# Patient Record
Sex: Female | Born: 1935 | Race: White | Hispanic: No | Smoking: Never smoker
Health system: Southern US, Community
[De-identification: ages and names within clinical notes are randomized; demographics above are authoritative.]

## PROBLEM LIST (undated history)

## (undated) DIAGNOSIS — N200 Calculus of kidney: Secondary | ICD-10-CM

## (undated) DIAGNOSIS — I639 Cerebral infarction, unspecified: Secondary | ICD-10-CM

## (undated) DIAGNOSIS — I10 Essential (primary) hypertension: Secondary | ICD-10-CM

## (undated) HISTORY — PX: OTHER SURGICAL HISTORY: SHX169

---

## 2010-12-23 HISTORY — PX: APPENDECTOMY: SHX54

## 2018-06-29 ENCOUNTER — Inpatient Hospital Stay (HOSPITAL_COMMUNITY): Payer: Medicare Other

## 2018-06-29 ENCOUNTER — Inpatient Hospital Stay (HOSPITAL_COMMUNITY)
Admission: EM | Admit: 2018-06-29 | Discharge: 2018-07-23 | DRG: 064 | Disposition: E | Payer: Medicare Other | Attending: Internal Medicine | Admitting: Internal Medicine

## 2018-06-29 ENCOUNTER — Encounter (HOSPITAL_COMMUNITY): Payer: Self-pay | Admitting: Emergency Medicine

## 2018-06-29 ENCOUNTER — Emergency Department (HOSPITAL_COMMUNITY): Payer: Medicare Other

## 2018-06-29 ENCOUNTER — Other Ambulatory Visit: Payer: Self-pay

## 2018-06-29 DIAGNOSIS — I63349 Cerebral infarction due to thrombosis of unspecified cerebellar artery: Secondary | ICD-10-CM | POA: Diagnosis not present

## 2018-06-29 DIAGNOSIS — Z79899 Other long term (current) drug therapy: Secondary | ICD-10-CM

## 2018-06-29 DIAGNOSIS — J690 Pneumonitis due to inhalation of food and vomit: Secondary | ICD-10-CM | POA: Diagnosis present

## 2018-06-29 DIAGNOSIS — I6350 Cerebral infarction due to unspecified occlusion or stenosis of unspecified cerebral artery: Secondary | ICD-10-CM | POA: Diagnosis not present

## 2018-06-29 DIAGNOSIS — I639 Cerebral infarction, unspecified: Secondary | ICD-10-CM | POA: Diagnosis not present

## 2018-06-29 DIAGNOSIS — R05 Cough: Secondary | ICD-10-CM | POA: Diagnosis not present

## 2018-06-29 DIAGNOSIS — Z880 Allergy status to penicillin: Secondary | ICD-10-CM | POA: Diagnosis not present

## 2018-06-29 DIAGNOSIS — R140 Abdominal distension (gaseous): Secondary | ICD-10-CM | POA: Diagnosis not present

## 2018-06-29 DIAGNOSIS — R1310 Dysphagia, unspecified: Secondary | ICD-10-CM | POA: Diagnosis present

## 2018-06-29 DIAGNOSIS — Z515 Encounter for palliative care: Secondary | ICD-10-CM | POA: Diagnosis present

## 2018-06-29 DIAGNOSIS — E873 Alkalosis: Secondary | ICD-10-CM | POA: Diagnosis not present

## 2018-06-29 DIAGNOSIS — R471 Dysarthria and anarthria: Secondary | ICD-10-CM | POA: Diagnosis present

## 2018-06-29 DIAGNOSIS — E871 Hypo-osmolality and hyponatremia: Secondary | ICD-10-CM | POA: Diagnosis present

## 2018-06-29 DIAGNOSIS — Z66 Do not resuscitate: Secondary | ICD-10-CM | POA: Diagnosis present

## 2018-06-29 DIAGNOSIS — H9190 Unspecified hearing loss, unspecified ear: Secondary | ICD-10-CM | POA: Diagnosis present

## 2018-06-29 DIAGNOSIS — R29810 Facial weakness: Secondary | ICD-10-CM | POA: Diagnosis present

## 2018-06-29 DIAGNOSIS — I10 Essential (primary) hypertension: Secondary | ICD-10-CM

## 2018-06-29 DIAGNOSIS — R0902 Hypoxemia: Secondary | ICD-10-CM

## 2018-06-29 DIAGNOSIS — Z7189 Other specified counseling: Secondary | ICD-10-CM | POA: Diagnosis not present

## 2018-06-29 DIAGNOSIS — I160 Hypertensive urgency: Secondary | ICD-10-CM | POA: Diagnosis not present

## 2018-06-29 DIAGNOSIS — N183 Chronic kidney disease, stage 3 (moderate): Secondary | ICD-10-CM | POA: Diagnosis present

## 2018-06-29 DIAGNOSIS — J9601 Acute respiratory failure with hypoxia: Secondary | ICD-10-CM | POA: Diagnosis not present

## 2018-06-29 DIAGNOSIS — G8101 Flaccid hemiplegia affecting right dominant side: Secondary | ICD-10-CM | POA: Diagnosis present

## 2018-06-29 DIAGNOSIS — R092 Respiratory arrest: Secondary | ICD-10-CM | POA: Diagnosis not present

## 2018-06-29 DIAGNOSIS — T17908A Unspecified foreign body in respiratory tract, part unspecified causing other injury, initial encounter: Secondary | ICD-10-CM

## 2018-06-29 DIAGNOSIS — B379 Candidiasis, unspecified: Secondary | ICD-10-CM | POA: Diagnosis present

## 2018-06-29 DIAGNOSIS — Z8249 Family history of ischemic heart disease and other diseases of the circulatory system: Secondary | ICD-10-CM

## 2018-06-29 DIAGNOSIS — Z888 Allergy status to other drugs, medicaments and biological substances status: Secondary | ICD-10-CM | POA: Diagnosis not present

## 2018-06-29 DIAGNOSIS — I129 Hypertensive chronic kidney disease with stage 1 through stage 4 chronic kidney disease, or unspecified chronic kidney disease: Secondary | ICD-10-CM | POA: Diagnosis present

## 2018-06-29 DIAGNOSIS — I517 Cardiomegaly: Secondary | ICD-10-CM | POA: Diagnosis present

## 2018-06-29 DIAGNOSIS — E7800 Pure hypercholesterolemia, unspecified: Secondary | ICD-10-CM | POA: Diagnosis present

## 2018-06-29 DIAGNOSIS — Z8673 Personal history of transient ischemic attack (TIA), and cerebral infarction without residual deficits: Secondary | ICD-10-CM | POA: Diagnosis not present

## 2018-06-29 DIAGNOSIS — I6329 Cerebral infarction due to unspecified occlusion or stenosis of other precerebral arteries: Secondary | ICD-10-CM | POA: Diagnosis not present

## 2018-06-29 DIAGNOSIS — E876 Hypokalemia: Secondary | ICD-10-CM | POA: Diagnosis present

## 2018-06-29 DIAGNOSIS — E440 Moderate protein-calorie malnutrition: Secondary | ICD-10-CM | POA: Diagnosis present

## 2018-06-29 DIAGNOSIS — B373 Candidiasis of vulva and vagina: Secondary | ICD-10-CM | POA: Diagnosis not present

## 2018-06-29 DIAGNOSIS — I63212 Cerebral infarction due to unspecified occlusion or stenosis of left vertebral arteries: Secondary | ICD-10-CM | POA: Diagnosis not present

## 2018-06-29 DIAGNOSIS — G8191 Hemiplegia, unspecified affecting right dominant side: Secondary | ICD-10-CM | POA: Diagnosis not present

## 2018-06-29 DIAGNOSIS — Z886 Allergy status to analgesic agent status: Secondary | ICD-10-CM | POA: Diagnosis not present

## 2018-06-29 DIAGNOSIS — I361 Nonrheumatic tricuspid (valve) insufficiency: Secondary | ICD-10-CM | POA: Diagnosis not present

## 2018-06-29 DIAGNOSIS — Z7982 Long term (current) use of aspirin: Secondary | ICD-10-CM

## 2018-06-29 DIAGNOSIS — R4701 Aphasia: Secondary | ICD-10-CM | POA: Diagnosis present

## 2018-06-29 DIAGNOSIS — M7989 Other specified soft tissue disorders: Secondary | ICD-10-CM | POA: Diagnosis not present

## 2018-06-29 DIAGNOSIS — E8770 Fluid overload, unspecified: Secondary | ICD-10-CM | POA: Diagnosis not present

## 2018-06-29 DIAGNOSIS — Z823 Family history of stroke: Secondary | ICD-10-CM

## 2018-06-29 DIAGNOSIS — E878 Other disorders of electrolyte and fluid balance, not elsewhere classified: Secondary | ICD-10-CM | POA: Diagnosis not present

## 2018-06-29 DIAGNOSIS — R531 Weakness: Secondary | ICD-10-CM

## 2018-06-29 DIAGNOSIS — I6309 Cerebral infarction due to thrombosis of other precerebral artery: Principal | ICD-10-CM | POA: Diagnosis present

## 2018-06-29 DIAGNOSIS — I6330 Cerebral infarction due to thrombosis of unspecified cerebral artery: Secondary | ICD-10-CM | POA: Diagnosis not present

## 2018-06-29 DIAGNOSIS — R197 Diarrhea, unspecified: Secondary | ICD-10-CM | POA: Diagnosis not present

## 2018-06-29 DIAGNOSIS — E785 Hyperlipidemia, unspecified: Secondary | ICD-10-CM | POA: Diagnosis not present

## 2018-06-29 DIAGNOSIS — M79609 Pain in unspecified limb: Secondary | ICD-10-CM | POA: Diagnosis not present

## 2018-06-29 HISTORY — DX: Calculus of kidney: N20.0

## 2018-06-29 HISTORY — DX: Cerebral infarction, unspecified: I63.9

## 2018-06-29 HISTORY — DX: Essential (primary) hypertension: I10

## 2018-06-29 LAB — URINALYSIS, ROUTINE W REFLEX MICROSCOPIC
Bilirubin Urine: NEGATIVE
Glucose, UA: NEGATIVE mg/dL
Hgb urine dipstick: NEGATIVE
KETONES UR: NEGATIVE mg/dL
LEUKOCYTES UA: NEGATIVE
Nitrite: NEGATIVE
PH: 8 (ref 5.0–8.0)
PROTEIN: NEGATIVE mg/dL
Specific Gravity, Urine: 1.014 (ref 1.005–1.030)

## 2018-06-29 LAB — I-STAT CHEM 8, ED
BUN: 31 mg/dL — AB (ref 8–23)
CALCIUM ION: 1.1 mmol/L — AB (ref 1.15–1.40)
CHLORIDE: 93 mmol/L — AB (ref 98–111)
Creatinine, Ser: 1.4 mg/dL — ABNORMAL HIGH (ref 0.44–1.00)
GLUCOSE: 125 mg/dL — AB (ref 70–99)
HCT: 47 % — ABNORMAL HIGH (ref 36.0–46.0)
Hemoglobin: 16 g/dL — ABNORMAL HIGH (ref 12.0–15.0)
POTASSIUM: 3.6 mmol/L (ref 3.5–5.1)
Sodium: 133 mmol/L — ABNORMAL LOW (ref 135–145)
TCO2: 35 mmol/L — ABNORMAL HIGH (ref 22–32)

## 2018-06-29 LAB — COMPREHENSIVE METABOLIC PANEL
ALBUMIN: 2.9 g/dL — AB (ref 3.5–5.0)
ALT: 22 U/L (ref 0–44)
AST: 38 U/L (ref 15–41)
Alkaline Phosphatase: 88 U/L (ref 38–126)
Anion gap: 13 (ref 5–15)
BUN: 24 mg/dL — AB (ref 8–23)
CALCIUM: 9 mg/dL (ref 8.9–10.3)
CHLORIDE: 89 mmol/L — AB (ref 98–111)
CO2: 30 mmol/L (ref 22–32)
CREATININE: 1.42 mg/dL — AB (ref 0.44–1.00)
GFR calc non Af Amer: 32 mL/min — ABNORMAL LOW (ref >60)
GFR, EST AFRICAN AMERICAN: 37 mL/min — AB (ref >60)
GLUCOSE: 123 mg/dL — AB (ref 70–99)
Potassium: 3.6 mmol/L (ref 3.5–5.1)
SODIUM: 132 mmol/L — AB (ref 135–145)
Total Bilirubin: 1.4 mg/dL — ABNORMAL HIGH (ref 0.3–1.2)
Total Protein: 6.1 g/dL — ABNORMAL LOW (ref 6.5–8.1)

## 2018-06-29 LAB — LIPID PANEL
CHOL/HDL RATIO: 4.2 ratio
CHOLESTEROL: 248 mg/dL — AB (ref 0–200)
HDL: 59 mg/dL (ref >40)
LDL Cholesterol: 171 mg/dL — ABNORMAL HIGH (ref 0–99)
Triglycerides: 89 mg/dL (ref <150)
VLDL: 18 mg/dL (ref 0–40)

## 2018-06-29 LAB — DIFFERENTIAL
Abs Immature Granulocytes: 0 10*3/uL (ref 0.0–0.1)
BASOS PCT: 0 %
Basophils Absolute: 0 10*3/uL (ref 0.0–0.1)
Eosinophils Absolute: 0.1 10*3/uL (ref 0.0–0.7)
Eosinophils Relative: 2 %
Immature Granulocytes: 0 %
Lymphocytes Relative: 11 %
Lymphs Abs: 0.5 10*3/uL — ABNORMAL LOW (ref 0.7–4.0)
MONO ABS: 0.5 10*3/uL (ref 0.1–1.0)
Monocytes Relative: 10 %
NEUTROS ABS: 3.7 10*3/uL (ref 1.7–7.7)
Neutrophils Relative %: 77 %

## 2018-06-29 LAB — PROTIME-INR
INR: 1.02
Prothrombin Time: 13.3 seconds (ref 11.4–15.2)

## 2018-06-29 LAB — RAPID URINE DRUG SCREEN, HOSP PERFORMED
Amphetamines: NOT DETECTED
Benzodiazepines: NOT DETECTED
Cocaine: NOT DETECTED
OPIATES: NOT DETECTED
TETRAHYDROCANNABINOL: NOT DETECTED

## 2018-06-29 LAB — ETHANOL: Alcohol, Ethyl (B): <10 mg/dL (ref <10)

## 2018-06-29 LAB — CBC
HEMATOCRIT: 44.7 % (ref 36.0–46.0)
Hemoglobin: 14.6 g/dL (ref 12.0–15.0)
MCH: 28.5 pg (ref 26.0–34.0)
MCHC: 32.7 g/dL (ref 30.0–36.0)
MCV: 87.3 fL (ref 78.0–100.0)
Platelets: 272 10*3/uL (ref 150–400)
RBC: 5.12 MIL/uL — ABNORMAL HIGH (ref 3.87–5.11)
RDW: 12.9 % (ref 11.5–15.5)
WBC: 4.8 10*3/uL (ref 4.0–10.5)

## 2018-06-29 LAB — I-STAT TROPONIN, ED: Troponin i, poc: 0.01 ng/mL (ref 0.00–0.08)

## 2018-06-29 LAB — APTT: aPTT: 29 seconds (ref 24–36)

## 2018-06-29 LAB — HEMOGLOBIN A1C
Hgb A1c MFr Bld: 5.7 % — ABNORMAL HIGH (ref 4.8–5.6)
MEAN PLASMA GLUCOSE: 116.89 mg/dL

## 2018-06-29 MED ORDER — IOPAMIDOL (ISOVUE-370) INJECTION 76%
100.0000 mL | Freq: Once | INTRAVENOUS | Status: AC | PRN
  Administered 2018-06-29: 100 mL via INTRAVENOUS

## 2018-06-29 MED ORDER — CLEVIDIPINE BUTYRATE 0.5 MG/ML IV EMUL
INTRAVENOUS | Status: AC
  Filled 2018-06-29: qty 50

## 2018-06-29 MED ORDER — VITAMIN D 1000 UNITS PO TABS
5000.0000 [IU] | ORAL_TABLET | Freq: Every day | ORAL | Status: DC
  Administered 2018-07-01 – 2018-07-04 (×4): 5000 [IU] via ORAL
  Filled 2018-06-29 (×5): qty 5

## 2018-06-29 MED ORDER — STROKE: EARLY STAGES OF RECOVERY BOOK
Freq: Once | Status: AC
  Administered 2018-06-29: 23:00:00
  Filled 2018-06-29: qty 1

## 2018-06-29 MED ORDER — ASPIRIN 325 MG PO TABS: 325.0000 mg | ORAL_TABLET | Freq: Every day | ORAL | Status: DC

## 2018-06-29 MED ORDER — VITAMIN B-12 100 MCG PO TABS
100.0000 ug | ORAL_TABLET | Freq: Every day | ORAL | Status: DC
  Administered 2018-07-01 – 2018-07-04 (×4): 100 ug via ORAL
  Filled 2018-06-29 (×6): qty 1

## 2018-06-29 NOTE — ED Triage Notes (Signed)
Pt arrive [facility] from pt family [facility] for stroke like symptoms, LSW 1230 before the pt layed down for a nap. At approc 1650 pt found with slurred speech, R facial droop, and R sided weakness. Vital with EMS 198/90 P76 CBG155 RR18 98%Ra EMS Fast ED LVO4. 20Lhand. Hx TIA and HTN

## 2018-06-29 NOTE — Consult Note (Signed)
Neurology Consultation Reason for Consult: Right-sided weakness Referring Physician: Long, J  CC: Right-sided weakness  History is obtained from: Patient  HPI: Gina Boone is a 84 y.o. female with a history of recent subarachnoid hemorrhage who presents with  Right-sided weakness.  She ate something around 1:40 PM and then laid down for nap, and on awakening she had severe right-sided weakness.  She is brought in as a code stroke given LVO scale four by EMS.  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  Due to her recent subarachnoid hemorrhage she is not a candidate for IV TPA.  She also has been moving slower since her subarachnoid per her daughter.  LKW: 1:40 PM tpa given?: no, recent subarachnoid hemorrhage Premorbid modified rankin scale: 1   On review of her records from [facility], she has small volume right frontal cortical subarachnoid hemorrhage.  No aneurysm was seen.  Of note, she went to the ER at [facility] last week with transient symptoms where head CT was performed she was discharged.  She was not restarted on her antiplatelets at that time given her recent subarachnoid hemorrhage.   ROS: A 14 point ROS was performed and is negative except as noted in the HPI.   PMH: TIA SAH HTN   FHx: Father-stroke  Social History: Does not smoke, lives alone prior to her recent subarachnoid hemorrhage, since hospitalization she has been staying with her daughter.   Exam: Current vital signs: BP (!) 213/98 (BP Location: Right Arm)   Pulse 81   Temp 97.9 F (36.6 C) (Temporal)   Resp (!) 23   SpO2 98%  Vital signs in last 24 hours: Temp:  [97.9 F (36.6 C)] 97.9 F (36.6 C) (07/08 1753) Pulse Rate:  [81] 81 (07/08 1753) Resp:  [23] 23 (07/08 1753) BP: (213)/(98) 213/98 (07/08 1753) SpO2:  [98 %] 98 % (07/08 1753)   Physical Exam  Constitutional: Appears well-developed and  well-nourished.  Psych: Affect appropriate to situation Eyes: No scleral injection HENT: No OP obstrucion Head: Normocephalic.  Cardiovascular: Normal rate and regular rhythm.  Respiratory: Effort normal, non-labored breathing GI: Soft.  No distension. There is no tenderness.  Skin: WDI  Neuro: Mental Status: Patient is awake, alert, she knows she is in a hospital but not which one, gives the month is May, gives her age of 73 No signs of aphasia or neglect Cranial Nerves: II: Visual Fields are full. Pupils are equal, round, and reactive to light.   III,IV, VI: EOMI without ptosis or diploplia.  V: Facial sensation is symmetric to temperature VII: Facial movement with right facial weakness VIII: hearing is intact to voice X: Uvula elevates symmetrically XI: Shoulder shrug is symmetric. XII: tongue is midline without atrophy or fasciculations.  Motor: Tone is normal. Bulk is normal.  She has a severe right hemiparesis with 1/5 strength in the right arm and leg Sensory: Sensation is symmetric to light touch and temperature in the arms and legs. Cerebellar: She has slow finger-nose-finger and heel-knee-shin on the left without clear past pointing.  I have reviewed labs in epic and the results pertinent to this consultation are: Creatinine 1.4, sodium 133  I have reviewed the images obtained: CT head/CTA-no LVO,?  Pontine stroke  Impression: 84 year old female with likely subcortical ischemic infarct.  At this time and to the MRI I would continue to hold her antiplatelet medications, but once the MRI is  performed she will likely benefit from restarting aspirin therapy.  Recommendations: 1. HgbA1c, fasting lipid panel 2. MRI, MRA  of the brain without contrast 3. Frequent neuro checks 4. Echocardiogram 5. Carotid dopplers 6. Prophylactic therapy-Antiplatelet med: Aspirin - dose 325mg  PO or 300mg  PR 7. Risk factor modification 8. Telemetry monitoring 9. PT consult, OT consult,  Speech consult 10. please page stroke NP  Or  PA  Or MD  from 8am -4 pm as this patient will be followed by the stroke team at this point.   You can look them up on [email or web address]    Gina SlotMcNeill Kinta Martis, MD [facility] [phone]  If 7pm- 7am, please page neurology on call as listed in AMION.

## 2018-06-29 NOTE — ED Provider Notes (Signed)
[facility] EMERGENCY DEPARTMENT Provider Note   CSN: [id number] Arrival date & time: 07/24/18  1718  History   Chief Complaint Chief Complaint  Patient presents with  . Code Stroke   HPI  Patient is an 84 year old female with history of HTN and recent TIA presenting to the ED as a code stroke for right-sided weakness and speech change. Per daughter at bedside, patient was last known well at about 1340 and patient then went to canal. Upon awakening, she had slurred speech, right-sided weakness, and was unable to sit up on her own. EMS was contacted to palpation to the ED. Daughter states patient has also been complaining of intermittent right-sided eye pain for the last several days. She was seen at another hospital 3 days ago and was told she may have had a TIA. Patient denies any current chest pain, eye pain, headache, or other pain. Neurologic symptoms are constant, severe, and unchanged.  Past Medical History:  Diagnosis Date  . Hypertension   . Stroke [facility])     There are no active problems to display for this patient.   Past Surgical History:  Procedure Laterality Date  . renal stents       OB History   None      Home Medications    Prior to Admission medications   Medication Sig Start Date End Date Taking? Authorizing Provider  Biotin 2.5 MG TABS Take 2.5 mg by mouth daily.   Yes [provider]  carvedilol (COREG) 6.25 MG tablet Take 6.25 mg by mouth 2 (two) times daily with a meal.   Yes [provider]  cholecalciferol (VITAMIN D) 1000 units tablet Take 5,000 Units by mouth daily.   Yes [provider]  cyanocobalamin 100 MCG tablet Take 100 mcg by mouth daily.   Yes [provider]  furosemide (LASIX) 40 MG tablet Take 60 mg by mouth daily. 04/27/18  Yes [provider]  niacin 500 MG tablet Take 500 mg by mouth at bedtime.   Yes [provider]  NIFEdipine (PROCARDIA-XL/ADALAT CC) 60 MG 24 hr  tablet Take 60 mg by mouth daily. 04/27/18  Yes [provider]    Family History History reviewed. No pertinent family history.  Social History Social History   Tobacco Use  . Smoking status: Never Smoker  . Smokeless tobacco: Never Used  Substance Use Topics  . Alcohol use: Not Currently    Frequency: Never  . Drug use: Never     Allergies   Amlodipine; Hydrochlorothiazide; Ibuprofen; and Penicillins   Review of Systems Review of Systems  Unable to perform ROS: Acuity of condition  Eyes: Positive for pain.  Respiratory: Negative for shortness of breath.   Cardiovascular: Negative for chest pain.  Gastrointestinal: Negative for abdominal pain.  Neurological: Positive for speech difficulty and weakness.     Physical Exam Updated Vital Signs BP (!) 167/120 (BP Location: Right Arm)   Pulse 87   Temp 98.2 F (36.8 C) (Oral)   Resp 18   Ht 5\' 4"  (1.626 m)   Wt 48.5 kg (106 lb 14.8 oz)   SpO2 99%   BMI 18.35 kg/m   Physical Exam  Constitutional: No distress.  HENT:  Head: Normocephalic and atraumatic.  Mouth/Throat: Oropharynx is clear and moist.  Eyes: Pupils are equal, round, and reactive to light. Conjunctivae and EOM are normal.  Neck: Neck supple. No tracheal deviation present.  Cardiovascular: Normal rate, regular rhythm, normal heart sounds and  intact distal pulses.  No murmur heard. Pulmonary/Chest: Effort normal and breath sounds normal. No stridor. No respiratory distress. She has no wheezes. She has no rales.  Abdominal: Soft. She exhibits no distension and no mass. There is no tenderness. There is no guarding.  Musculoskeletal: She exhibits no edema or deformity.  Neurological: She is alert.  Speech is dysarthric. Right-sided facial weakness. Tongue protrusion midline. 4/5 strength in RUE. 3/5 strength in RLE. 5/5 strength in LUE and LLE.  Skin: Skin is warm and dry.  Nursing note and vitals reviewed.    ED Treatments / Results   Labs (all labs ordered are listed, but only abnormal results are displayed) Labs Reviewed  CBC - Abnormal; Notable for the following components:      Result Value   RBC 5.12 (*)    All other components within normal limits  DIFFERENTIAL - Abnormal; Notable for the following components:   Lymphs Abs 0.5 (*)    All other components within normal limits  COMPREHENSIVE METABOLIC PANEL - Abnormal; Notable for the following components:   Sodium 132 (*)    Chloride 89 (*)    Glucose, Bld 123 (*)    BUN 24 (*)    Creatinine, Ser 1.42 (*)    Total Protein 6.1 (*)    Albumin 2.9 (*)    Total Bilirubin 1.4 (*)    GFR calc non Af Amer 32 (*)    GFR calc Af Amer 37 (*)    All other components within normal limits  RAPID URINE DRUG SCREEN, HOSP PERFORMED - Abnormal; Notable for the following components:   Barbiturates   (*)    Value: Result not available. Reagent lot number recalled by manufacturer.   All other components within normal limits  URINALYSIS, ROUTINE W REFLEX MICROSCOPIC - Abnormal; Notable for the following components:   Color, Urine STRAW (*)    All other components within normal limits  HEMOGLOBIN A1C - Abnormal; Notable for the following components:   Hgb A1c MFr Bld 5.7 (*)    All other components within normal limits  LIPID PANEL - Abnormal; Notable for the following components:   Cholesterol 248 (*)    LDL Cholesterol 171 (*)    All other components within normal limits  I-STAT CHEM 8, ED - Abnormal; Notable for the following components:   Sodium 133 (*)    Chloride 93 (*)    BUN 31 (*)    Creatinine, Ser 1.40 (*)    Glucose, Bld 125 (*)    Calcium, Ion 1.10 (*)    TCO2 35 (*)    Hemoglobin 16.0 (*)    HCT 47.0 (*)    All other components within normal limits  PROTIME-INR  APTT  ETHANOL  BASIC METABOLIC PANEL  I-STAT TROPONIN, ED  CBG MONITORING, ED  I-STAT CHEM 8, ED  I-STAT TROPONIN, ED    EKG EKG Interpretation  Date/Time:  Monday 06/30/2018  17:50:20 EDT Ventricular Rate:  83 PR Interval:    QRS Duration: 88 QT Interval:  439 QTC Calculation: 516 R Axis:   93 Text Interpretation:  Sinus rhythm LAE, consider biatrial enlargement Right axis deviation Non-specific ST changes.  Prolonged QT interval No STEMI. No prior for comparison.  Confirmed by Alona Bene [phone]) on 2018-06-30 5:57:31 PM   Radiology Ct Angio Head W Or Wo Contrast  Result Date: 2018-06-30 CLINICAL DATA:  84 y/o  F; slurred speech and right-sided deficits. EXAM: CT ANGIOGRAPHY HEAD AND NECK  CT PERFUSION BRAIN TECHNIQUE: Multidetector CT imaging of the head and neck was performed using the standard protocol during bolus administration of intravenous contrast. Multiplanar CT image reconstructions and MIPs were obtained to evaluate the vascular anatomy. Carotid stenosis measurements (when applicable) are obtained utilizing NASCET criteria, using the distal internal carotid diameter as the denominator. Multiphase CT imaging of the brain was performed following IV bolus contrast injection. Subsequent parametric perfusion maps were calculated using RAPID software. CONTRAST:  100mL ISOVUE-370 IOPAMIDOL (ISOVUE-370) INJECTION 76% COMPARISON:  05/22/2018 CT head. FINDINGS: CTA NECK FINDINGS Aortic arch: Standard branching. Imaged portion shows no evidence of aneurysm or dissection. Left subclavian artery origin and downstream tandem segments of 50% moderate stenosis with predominant fibrofatty plaque. Moderate mixed plaque of the aortic arch. Right carotid system: No evidence of dissection, stenosis (50% or greater) or occlusion. Calcified plaque of the carotid bifurcation with mild less than 50% proximal ICA stenosis. Left carotid system: No evidence of dissection, stenosis (50% or greater) or occlusion. Calcified plaque of the carotid bifurcation with mild less 50% proximal ICA stenosis. Vertebral arteries: Widely patent right dominant vertebral artery. Proximal left vertebral artery  is patent with several segments of stenosis. The downstream vessel as a gradient decrease in attenuation with occlusion at the V3 segment. The left vertebral artery between basilar PICA is patent although diminutive. Skeleton: Moderate cervical spondylosis with multilevel disc and facet degenerative changes. Grade 1 C4-5 anterolisthesis. No high-grade bony canal stenosis. Other neck: Subcentimeter thyroid nodules. Upper chest: Mild biapical pleuroparenchymal scarring. Review of the MIP images confirms the above findings CTA HEAD FINDINGS Anterior circulation: Calcified plaque of the carotid siphons with moderate 50% bilateral paraclinoid stenosis. No large vessel occlusion, aneurysm, or additional segment of high-grade stenosis. Posterior circulation: Patent right vertebral artery. Occluded left V3 and V4 to the PICA origin. The left vertebral artery between the vertebrobasilar junction and PICA origin is patent and diminutive. Basilar artery is patent with lumen irregularity of multiple segments of mild stenosis. Patent bilateral PCA. Right distal P2 segment of moderate stenosis and bilateral proximal P2 segments of mild stenosis. Venous sinuses: As permitted by contrast timing, patent. Anatomic variants: Fetal right PCA. Probable small anterior communicating artery. No left posterior communicating artery identified, likely hypoplastic or absent. Review of the MIP images confirms the above findings CT Brain Perfusion Findings: CBF (<30%) Volume: 0mL Perfusion (Tmax>6.0s) volume: 0mL Mismatch Volume: 0mL Infarction Location:Negative. IMPRESSION: 1. Normal CT brain perfusion. 2. 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 Patent diminutive left vertebral artery between vertebrobasilar junction and left PICA origin. 3. Patent carotid systems and right vertebral artery without dissection or high-grade stenosis. 4. Patent circle-of-Willis, bilateral ACA, bilateral MCA, and  bilateral PCA. No large vessel occlusion or aneurysm. 5. Calcified plaque of carotid bifurcations with bilateral proximal ICA mild less 50% stenosis. 6. Calcified plaque of carotid siphons with moderate bilateral paraclinoid stenosis. These results were called by telephone at the time of interpretation on 06/28/2018 at 6:21 pm to Dr. Ritta SlotMCNEILL KIRKPATRICK , who verbally acknowledged these results. Electronically Signed   By: Mitzi HansenLance  Furusawa-Stratton M.D.   On: 05/22/2018 18:21   Ct Angio Neck W Or Wo Contrast  Result Date: 06/22/2018 CLINICAL DATA:  84 y/o  F; slurred speech and right-sided deficits. EXAM: CT ANGIOGRAPHY HEAD AND NECK CT PERFUSION BRAIN TECHNIQUE: Multidetector CT imaging of the head and neck was performed using the standard protocol during bolus administration of intravenous contrast. Multiplanar CT image reconstructions and  MIPs were obtained to evaluate the vascular anatomy. Carotid stenosis measurements (when applicable) are obtained utilizing NASCET criteria, using the distal internal carotid diameter as the denominator. Multiphase CT imaging of the brain was performed following IV bolus contrast injection. Subsequent parametric perfusion maps were calculated using RAPID software. CONTRAST:  ISOVUE-370 IOPAMIDOL (ISOVUE-370) INJECTION 76% COMPARISON:  06/30/2018 CT head. FINDINGS: CTA NECK FINDINGS Aortic arch: Standard branching. Imaged portion shows no evidence of aneurysm or dissection. Left subclavian artery origin and downstream tandem segments of 50% moderate stenosis with predominant fibrofatty plaque. Moderate mixed plaque of the aortic arch. Right carotid system: No evidence of dissection, stenosis (50% or greater) or occlusion. Calcified plaque of the carotid bifurcation with mild less than 50% proximal ICA stenosis. Left carotid system: No evidence of dissection, stenosis (50% or greater) or occlusion. Calcified plaque of the carotid bifurcation with mild less 50% proximal ICA  stenosis. Vertebral arteries: Widely patent right dominant vertebral artery. Proximal left vertebral artery is patent with several segments of stenosis. The downstream vessel as a gradient decrease in attenuation with occlusion at the V3 segment. The left vertebral artery between basilar PICA is patent although diminutive. Skeleton: Moderate cervical spondylosis with multilevel disc and facet degenerative changes. Grade 1 C4-5 anterolisthesis. No high-grade bony canal stenosis. Other neck: Subcentimeter thyroid nodules. Upper chest: Mild biapical pleuroparenchymal scarring. Review of the MIP images confirms the above findings CTA HEAD FINDINGS Anterior circulation: Calcified plaque of the carotid siphons with moderate 50% bilateral paraclinoid stenosis. No large vessel occlusion, aneurysm, or additional segment of high-grade stenosis. Posterior circulation: Patent right vertebral artery. Occluded left V3 and V4 to the PICA origin. The left vertebral artery between the vertebrobasilar junction and PICA origin is patent and diminutive. Basilar artery is patent with lumen irregularity of multiple segments of mild stenosis. Patent bilateral PCA. Right distal P2 segment of moderate stenosis and bilateral proximal P2 segments of mild stenosis. Venous sinuses: As permitted by contrast timing, patent. Anatomic variants: Fetal right PCA. Probable small anterior communicating artery. No left posterior communicating artery identified, likely hypoplastic or absent. Review of the MIP images confirms the above findings CT Brain Perfusion Findings: CBF (<30%) Volume: 0mL Perfusion (Tmax>6.0s) volume: 0mL Mismatch Volume: 0mL Infarction Location:Negative. IMPRESSION: 1. Normal CT brain perfusion. 2. 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 Patent diminutive left vertebral artery between vertebrobasilar junction and left PICA origin. 3. Patent carotid systems and right vertebral artery  without dissection or high-grade stenosis. 4. Patent circle-of-Willis, bilateral ACA, bilateral MCA, and bilateral PCA. No large vessel occlusion or aneurysm. 5. Calcified plaque of carotid bifurcations with bilateral proximal ICA mild less 50% stenosis. 6. Calcified plaque of carotid siphons with moderate bilateral paraclinoid stenosis. These results were called by telephone at the time of interpretation on 2018-06-30 at 6:21 pm to Dr. Ritta Slot , who verbally acknowledged these results. Electronically Signed   By: Mitzi Hansen M.D.   On: 06-30-18 18:21   Ct Cerebral Perfusion W Contrast  Result Date: 06/30/18 CLINICAL DATA:  84 y/o  F; slurred speech and right-sided deficits. EXAM: CT ANGIOGRAPHY HEAD AND NECK CT PERFUSION BRAIN TECHNIQUE: Multidetector CT imaging of the head and neck was performed using the standard protocol during bolus administration of intravenous contrast. Multiplanar CT image reconstructions and MIPs were obtained to evaluate the vascular anatomy. Carotid stenosis measurements (when applicable) are obtained utilizing NASCET criteria, using the distal internal carotid diameter as the denominator. Multiphase CT imaging of  the brain was performed following IV bolus contrast injection. Subsequent parametric perfusion maps were calculated using RAPID software. CONTRAST:  ISOVUE-370 IOPAMIDOL (ISOVUE-370) INJECTION 76% COMPARISON:  07/03/2018 CT head. FINDINGS: CTA NECK FINDINGS Aortic arch: Standard branching. Imaged portion shows no evidence of aneurysm or dissection. Left subclavian artery origin and downstream tandem segments of 50% moderate stenosis with predominant fibrofatty plaque. Moderate mixed plaque of the aortic arch. Right carotid system: No evidence of dissection, stenosis (50% or greater) or occlusion. Calcified plaque of the carotid bifurcation with mild less than 50% proximal ICA stenosis. Left carotid system: No evidence of dissection, stenosis  (50% or greater) or occlusion. Calcified plaque of the carotid bifurcation with mild less 50% proximal ICA stenosis. Vertebral arteries: Widely patent right dominant vertebral artery. Proximal left vertebral artery is patent with several segments of stenosis. The downstream vessel as a gradient decrease in attenuation with occlusion at the V3 segment. The left vertebral artery between basilar PICA is patent although diminutive. Skeleton: Moderate cervical spondylosis with multilevel disc and facet degenerative changes. Grade 1 C4-5 anterolisthesis. No high-grade bony canal stenosis. Other neck: Subcentimeter thyroid nodules. Upper chest: Mild biapical pleuroparenchymal scarring. Review of the MIP images confirms the above findings CTA HEAD FINDINGS Anterior circulation: Calcified plaque of the carotid siphons with moderate 50% bilateral paraclinoid stenosis. No large vessel occlusion, aneurysm, or additional segment of high-grade stenosis. Posterior circulation: Patent right vertebral artery. Occluded left V3 and V4 to the PICA origin. The left vertebral artery between the vertebrobasilar junction and PICA origin is patent and diminutive. Basilar artery is patent with lumen irregularity of multiple segments of mild stenosis. Patent bilateral PCA. Right distal P2 segment of moderate stenosis and bilateral proximal P2 segments of mild stenosis. Venous sinuses: As permitted by contrast timing, patent. Anatomic variants: Fetal right PCA. Probable small anterior communicating artery. No left posterior communicating artery identified, likely hypoplastic or absent. Review of the MIP images confirms the above findings CT Brain Perfusion Findings: CBF (<30%) Volume: 0mL Perfusion (Tmax>6.0s) volume: 0mL Mismatch Volume: 0mL Infarction Location:Negative. IMPRESSION: 1. Normal CT brain perfusion. 2. 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 Patent diminutive left vertebral artery  between vertebrobasilar junction and left PICA origin. 3. Patent carotid systems and right vertebral artery without dissection or high-grade stenosis. 4. Patent circle-of-Willis, bilateral ACA, bilateral MCA, and bilateral PCA. No large vessel occlusion or aneurysm. 5. Calcified plaque of carotid bifurcations with bilateral proximal ICA mild less 50% stenosis. 6. Calcified plaque of carotid siphons with moderate bilateral paraclinoid stenosis. These results were called by telephone at the time of interpretation on 06/22/2018 at 6:21 pm to Dr. Ritta Slot , who verbally acknowledged these results. Electronically Signed   By: Mitzi Hansen M.D.   On: 07/01/2018 18:21   Ct Head Code Stroke Wo Contrast  Result Date: 07/19/2018 CLINICAL DATA:  Code stroke. Right-sided facial droop and slurred speech. EXAM: CT HEAD WITHOUT CONTRAST TECHNIQUE: Contiguous axial images were obtained from the base of the skull through the vertex without intravenous contrast. COMPARISON:  None. FINDINGS: Brain: Linear lucency in left hemi pons may represent a pontine perforator infarction, age indeterminate. No additional acute infarction identified. No hemorrhage, extra-axial collection, hydrocephalus, or herniation. Mild for age chronic microvascular ischemic changes and parenchymal volume loss of the brain. Small chronic lacunar infarct in the right putamen. Vascular: Calcific atherosclerosis of the carotid siphons. No hyperdense vessel identified. Skull: Normal. Negative for fracture or focal lesion. Sinuses/Orbits: No  acute finding. Other: None. ASPECTS [facility] Stroke Program Early CT Score) - Ganglionic level infarction (caudate, lentiform nuclei, internal capsule, insula, M1-M3 cortex): 7 - Supraganglionic infarction (M4-M6 cortex): 3 Total score (0-10 with 10 being normal): 10 IMPRESSION: 1. Linear lucency in left paramedian pons compatible with pontine perforator infarct, age indeterminate. 2. Mild chronic  microvascular ischemic changes and parenchymal volume loss of the brain for age. 3. ASPECTS is 10 These results were called by telephone at the time of interpretation on 06/28/2018 at 5:37 pm to Dr. Amada Jupiter, who verbally acknowledged these results. Electronically Signed   By: Mitzi Hansen M.D.   On: 07/02/2018 17:39    Procedures Procedures (including critical care time)   Initial Impression / Assessment and Plan / ED Course  I have reviewed the triage vital signs and the nursing notes.  Pertinent labs & imaging results that were available during my care of the patient were reviewed by me and considered in my medical decision making (see chart for details).  Elderly female arriving to the ED via EMS as Code Stroke for acute onset of right-sided weakness and speech change as above. Evaluated concurrently with neurology. CT brain perfusion normal. No ICH. Clinical picture consistent with CVA. She is not a tPA candidate due to recent [facility] Screening labs showed renal insufficiency of unclear acuity. Patient admitted to medicine for further management.  Final Clinical Impressions(s) / ED Diagnoses   Final diagnoses:  Dysarthria  Weakness    ED Discharge Orders    None       Cecille Po, MD 06/30/18 [id number]    Maia Plan, MD 06/30/18 1113

## 2018-06-29 NOTE — ED Notes (Signed)
Patient transported to MRI 

## 2018-06-29 NOTE — Progress Notes (Signed)
Patient arrived around 2130 alert and oriented times 4, some pain on right side of neck MRI positive for ischemic stroke left paramedian pontine acute/early subacute ischemic stroke, no hemorrage or mass efect. Occluded left vertebral artery below the PICA origin. She has mild disarthria and some edema right lower extremity. She is NPO and will be assessed for swallow by SLP in AM. She has Q 2 nero checks and vital signs until 0600 AM.

## 2018-06-29 NOTE — H&P (Signed)
Date: 06/30/2018               Patient Name:  Gina Boone MRN: [id number]  DOB: December 02, 1929 Age / Sex: 84 y.o., female   PCP: Patient, No Pcp Per         Medical Service: Internal Medicine Teaching Service         Attending Physician: Dr. Doneen Poisson, MD    First Contact: Dr. Maryla Morrow Pager: [phone]  Second Contact: Dr. Nelson Chimes Pager: [phone]       After Hours (After 5p/  First Contact Pager: [phone]  weekends / holidays): Second Contact Pager: [phone]   Chief Complaint: Right sided weakness  History of Present Illness:  This is a 84 year old female with a PMH of recent subarchnoid hemmorhage, HTN, HLD, and CKD stage 3 presenting with new onset right sided weakness and speech changes. She is accompanied by her daughter and grandson. The daughter provided most of the information. The patient was last seen normal at 1:40 PM when she was eating and drinking. She then went to take a nap. When the daughter was at home she went to wake the patient at 4:00 PM to give her something to drink. She noticed that the right sided facial droop and right sided weakness so she called EMS. She denies any chest pain or palpitations. She has not had any falls. She has been having frequent right eye pain that is sharp in nature and lasts for 1-2 minutes. Denies any blurry vision. She was recently diagnosed with a subarachnoid hemorrhage and after that discharge she has started living with her daughter. Before that occurred she was living on her own and was able to take care of herself. She has been using a walker to get around, the daughter states that this is mainly due to dizziness that has been occurring.   She was hospitalized at [facility] from June 23rd-29th for right sided facial droop and slurred speech . She had an MRI that showed small volume right frontal cortical SAH. She was discharged with home PT/OT therapy and they stopped her ASA. She went to the ER on July 5th for facial droop and  slurred speech, both resolved while she was there and a CT head was negative for any new bleeding or new stroke. They attributed this to a TIA and didn't restart her ASA due to recent [facility].   In ED she was hypertensive, tachypneic, and afebrile. Her labs showed elevated BUN of 24 elevated Cr of 1.42, and hyponatermia of 132. Her CBC was WNL. She had a CT scan that showed linear lucency in the paramedian pons consistent with a pontine perforater infarct. Her EKG showed NSR. She was evaluated by neurology and was on the borderline time frame for tPA however this was not given due to her recent [facility]. She was admitted to internal medicine.   Meds:  No current facility-administered medications on file prior to encounter.    Current Outpatient Medications on File Prior to Encounter  Medication Sig Dispense Refill  . Biotin 2.5 MG TABS Take 2.5 mg by mouth daily.    . carvedilol (COREG) 6.25 MG tablet Take 6.25 mg by mouth 2 (two) times daily with a meal.    . cholecalciferol (VITAMIN D) 1000 units tablet Take 5,000 Units by mouth daily.    . cyanocobalamin 100 MCG tablet Take 100 mcg by mouth daily.    . furosemide (LASIX) 40 MG tablet Take 60 mg by  mouth daily.  5  . niacin 500 MG tablet Take 500 mg by mouth at bedtime.    Marland Kitchen NIFEdipine (PROCARDIA-XL/ADALAT CC) 60 MG 24 hr tablet Take 60 mg by mouth daily.  5    Current Meds  Medication Sig  . Biotin 2.5 MG TABS Take 2.5 mg by mouth daily.  . carvedilol (COREG) 6.25 MG tablet Take 6.25 mg by mouth 2 (two) times daily with a meal.  . cholecalciferol (VITAMIN D) 1000 units tablet Take 5,000 Units by mouth daily.  . cyanocobalamin 100 MCG tablet Take 100 mcg by mouth daily.  . furosemide (LASIX) 40 MG tablet Take 60 mg by mouth daily.  . niacin 500 MG tablet Take 500 mg by mouth at bedtime.  Marland Kitchen NIFEdipine (PROCARDIA-XL/ADALAT CC) 60 MG 24 hr tablet Take 60 mg by mouth daily.     Allergies: Allergies as of 07/14/18 - Review Complete 07/14/2018    Allergen Reaction Noted  . Amlodipine Other (See Comments) Jul 14, 2018  . Hydrochlorothiazide Other (See Comments) 07-14-2018  . Ibuprofen  07/14/18  . Penicillins Other (See Comments) 2018-07-14   Past Medical History:  Diagnosis Date  . Hypertension   . Stroke [facility])     Family History: Mother and father had heart disease.   Social History: Denies smoking, EtOH, or other drug use. Patient is a retired Scientist, forensic. She lives with her daughter at the moment.   Review of Systems: A complete ROS was negative except as per HPI.   Physical Exam: Blood pressure (!) 210/110, pulse 87, temperature 98.1 F (36.7 C), temperature source Oral, resp. rate 18, height 5\' 4"  (1.626 m), weight 106 lb 14.8 oz (48.5 kg), SpO2 99 %. Physical Exam  Constitutional: She is oriented to person, place, and time.  Tired appearing, appears stated age, not in acute distress.    HENT:  Head: Normocephalic and atraumatic.  Eyes: Pupils are equal, round, and reactive to light. Conjunctivae, EOM and lids are normal.  Neck: Trachea normal. Normal carotid pulses present. Carotid bruit is not present.  Cardiovascular: Normal rate, regular rhythm, normal heart sounds and normal pulses.  Pulmonary/Chest: Effort normal and breath sounds normal.  Abdominal: Soft. Normal appearance and bowel sounds are normal. There is no tenderness.  Neurological: She is alert and oriented to person, place, and time.  Oriented x3. Correctly identifies objects and repeats no if's and's or but's.  CN 2-12: grossly intact except for right sided facial droop and dysarthria.  Motor: RUE 1/5, RLE 1/5, LUE 5/5, LLE 5/5 Sensation: Intact to soft touch.  Finger to nose test intact.    Psychiatric: Mood and affect normal.     EKG: personally reviewed my interpretation is NSR with left atrial enlargement  CT head: personally reviewed my interpretation is left pontine infarct  Assessment & Plan by Problem:  This is a 84 year old  female with a PMH of recent subarchnoid hemmorhage, HTN, HLD, and CKD stage 3 presenting with new onset right sided weakness, right facial droop, and speech changes. She was admitted for code stroke.   CVA: Patient is presenting with right sided weakness, right facial droop, and dysarthria. She was found to be hypertensive, tachypneic, and afebrile. On exam she had right upper and lower extremity weakness, and right sided facial droop with dysarthria.  Her CT head was negative for acute bleed. 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 She has a history of recently diagnosed subarchnoid hemorrhage in 1 month ago and had stopped her ASA at that time. Unclear etiology, could be 2/2 to left vertebral artery occlusion. Will follow up neurology recommendation.  -- Neurology assessed and recommended hold any antiplatelets until MRI than there is likely a benefit of restarting ASA therapy -- CTA head & neck  -- Allow for permissive HTN, systolic < 220 and diastolic < 120.  -- ASA 325 mg daily  -- Echocardiogram  -- A1C  -- Lipid panel  -- Tele monitoring  -- SLP eval -- PT/OT  CKD stage 3:  Patient has a history of CKD. On admission her BUN was 24 and Cr was 1.42. Baseline is unknown.  - Repeat BMP in the morning  HTN: Patient was hypertensive on arrival. She is on carvedilol and nifedipine at home. Currently holding the medications to allow for permissive hypertension.  - Hold home medications  FEN: No fluids, replete lytes prn, NPO VTE ppx: SCDs Code Status: FULL   Dispo: Admit patient to Inpatient with expected length of stay greater than 2 midnights.  Signed: Claudean SeveranceKrienke, Marissa M, MD 06/30/2018, 3:19 AM  Pager: [phone]

## 2018-06-29 NOTE — ED Notes (Signed)
Cleaned pt and placed pure wick on pt

## 2018-06-29 NOTE — ED Notes (Signed)
Pt returned to ED from MRI. To be transported to [facility] at this time.

## 2018-06-30 ENCOUNTER — Inpatient Hospital Stay (HOSPITAL_COMMUNITY): Payer: Medicare Other

## 2018-06-30 ENCOUNTER — Encounter (HOSPITAL_COMMUNITY): Payer: Self-pay | Admitting: Physical Medicine and Rehabilitation

## 2018-06-30 DIAGNOSIS — Z880 Allergy status to penicillin: Secondary | ICD-10-CM

## 2018-06-30 DIAGNOSIS — E785 Hyperlipidemia, unspecified: Secondary | ICD-10-CM

## 2018-06-30 DIAGNOSIS — R471 Dysarthria and anarthria: Secondary | ICD-10-CM

## 2018-06-30 DIAGNOSIS — Z79899 Other long term (current) drug therapy: Secondary | ICD-10-CM

## 2018-06-30 DIAGNOSIS — Z8673 Personal history of transient ischemic attack (TIA), and cerebral infarction without residual deficits: Secondary | ICD-10-CM

## 2018-06-30 DIAGNOSIS — I6350 Cerebral infarction due to unspecified occlusion or stenosis of unspecified cerebral artery: Secondary | ICD-10-CM

## 2018-06-30 DIAGNOSIS — I6330 Cerebral infarction due to thrombosis of unspecified cerebral artery: Secondary | ICD-10-CM

## 2018-06-30 DIAGNOSIS — R1310 Dysphagia, unspecified: Secondary | ICD-10-CM

## 2018-06-30 DIAGNOSIS — I129 Hypertensive chronic kidney disease with stage 1 through stage 4 chronic kidney disease, or unspecified chronic kidney disease: Secondary | ICD-10-CM

## 2018-06-30 DIAGNOSIS — I361 Nonrheumatic tricuspid (valve) insufficiency: Secondary | ICD-10-CM

## 2018-06-30 DIAGNOSIS — G8101 Flaccid hemiplegia affecting right dominant side: Secondary | ICD-10-CM

## 2018-06-30 DIAGNOSIS — N183 Chronic kidney disease, stage 3 (moderate): Secondary | ICD-10-CM

## 2018-06-30 DIAGNOSIS — R05 Cough: Secondary | ICD-10-CM

## 2018-06-30 DIAGNOSIS — E7800 Pure hypercholesterolemia, unspecified: Secondary | ICD-10-CM

## 2018-06-30 DIAGNOSIS — T17908A Unspecified foreign body in respiratory tract, part unspecified causing other injury, initial encounter: Secondary | ICD-10-CM

## 2018-06-30 DIAGNOSIS — I10 Essential (primary) hypertension: Secondary | ICD-10-CM

## 2018-06-30 DIAGNOSIS — R29810 Facial weakness: Secondary | ICD-10-CM

## 2018-06-30 DIAGNOSIS — Z886 Allergy status to analgesic agent status: Secondary | ICD-10-CM

## 2018-06-30 DIAGNOSIS — I6329 Cerebral infarction due to unspecified occlusion or stenosis of other precerebral arteries: Secondary | ICD-10-CM

## 2018-06-30 DIAGNOSIS — Z888 Allergy status to other drugs, medicaments and biological substances status: Secondary | ICD-10-CM

## 2018-06-30 DIAGNOSIS — I639 Cerebral infarction, unspecified: Secondary | ICD-10-CM

## 2018-06-30 DIAGNOSIS — Z8249 Family history of ischemic heart disease and other diseases of the circulatory system: Secondary | ICD-10-CM

## 2018-06-30 DIAGNOSIS — E876 Hypokalemia: Secondary | ICD-10-CM

## 2018-06-30 LAB — BASIC METABOLIC PANEL
ANION GAP: 15 (ref 5–15)
BUN: 22 mg/dL (ref 8–23)
CALCIUM: 9.6 mg/dL (ref 8.9–10.3)
CO2: 31 mmol/L (ref 22–32)
Chloride: 91 mmol/L — ABNORMAL LOW (ref 98–111)
Creatinine, Ser: 1.27 mg/dL — ABNORMAL HIGH (ref 0.44–1.00)
GFR, EST AFRICAN AMERICAN: 43 mL/min — AB (ref >60)
GFR, EST NON AFRICAN AMERICAN: 37 mL/min — AB (ref >60)
Glucose, Bld: 107 mg/dL — ABNORMAL HIGH (ref 70–99)
POTASSIUM: 3.2 mmol/L — AB (ref 3.5–5.1)
Sodium: 137 mmol/L (ref 135–145)

## 2018-06-30 LAB — ECHOCARDIOGRAM COMPLETE
HEIGHTINCHES: 64 in
Weight: 1710.77 oz

## 2018-06-30 LAB — SEDIMENTATION RATE: Sed Rate: 24 mm/hr — ABNORMAL HIGH (ref 0–22)

## 2018-06-30 MED ORDER — DEXTROSE-NACL 5-0.45 % IV SOLN
INTRAVENOUS | Status: DC
Start: 2018-06-30 — End: 2018-07-03
  Administered 2018-06-30: 15:00:00 via INTRAVENOUS
  Administered 2018-07-01: 1000 mL via INTRAVENOUS
  Administered 2018-07-02: 23:00:00 via INTRAVENOUS

## 2018-06-30 MED ORDER — TOPIRAMATE 25 MG PO TABS
25.0000 mg | ORAL_TABLET | Freq: Two times a day (BID) | ORAL | Status: DC
  Administered 2018-07-01 – 2018-07-04 (×8): 25 mg via ORAL
  Filled 2018-06-30 (×8): qty 1

## 2018-06-30 MED ORDER — LABETALOL HCL 5 MG/ML IV SOLN
5.0000 mg | INTRAVENOUS | Status: DC | PRN
  Administered 2018-06-30 – 2018-07-01 (×3): 5 mg via INTRAVENOUS
  Filled 2018-06-30 (×3): qty 4

## 2018-06-30 MED ORDER — POTASSIUM CHLORIDE 10 MEQ/100ML IV SOLN
10.0000 meq | INTRAVENOUS | Status: AC
  Administered 2018-06-30 (×4): 10 meq via INTRAVENOUS
  Filled 2018-06-30 (×4): qty 100

## 2018-06-30 MED ORDER — ASPIRIN 300 MG RE SUPP
300.0000 mg | Freq: Every day | RECTAL | Status: DC
  Administered 2018-06-30 – 2018-07-01 (×2): 300 mg via RECTAL
  Filled 2018-06-30 (×2): qty 1

## 2018-06-30 NOTE — Progress Notes (Addendum)
STROKE TEAM PROGRESS NOTE   INTERVAL HISTORY Her daughter is at the bedside.  Pt currently undergoing 2D echo in the room. Daughter called 911 from home. She has multiple uncontrolled risk factors. Pt stable over night. Testing underway.I have reviewed in detail with patient's history of present illness with her daughter.I have also reviewed in care everywhere her prior hospitalization records from [facility] and  [place] had right frontal nontraumatic subarachnoid hemorrhage on 06/14/18 and was admitted to [facility] and apparently no aneurysm or source of hemorrhage was found. She is discharged home with home therapy and was doing well except some memory difficulties. She was again evaluated at [facility] on 06/26/18 for possible TIA and CT scan report that day showed complete resolution of the subarachnoid blood and no acute abnormality.she had previously been on aspirin which was discontinued after her subarachnoid hemorrhage.  Vitals:   06/30/18 0130 06/30/18 0330 06/30/18 0530 06/30/18 0800  BP: (!) 210/110 (!) 198/79 (!) 183/79 (!) 195/81  Pulse:    85  Resp:    19  Temp: 98.1 F (36.7 C) 98.2 F (36.8 C) 98.1 F (36.7 C)   TempSrc: Oral Oral Oral   SpO2:    98%  Weight:      Height:        CBC:  Recent Labs  Lab 07/22/2018 1729 07/04/2018 1814  WBC  --  4.8  NEUTROABS  --  3.7  HGB 16.0* 14.6  HCT 47.0* 44.7  MCV  --  87.3  PLT  --  226    Basic Metabolic Panel:  Recent Labs  Lab 07/02/2018 1814 06/30/18 0338  NA 132* 137  K 3.6 3.2*  CL 89* 91*  CO2 30 31  GLUCOSE 123* 107*  BUN 24* 22  CREATININE 1.42* 1.27*  CALCIUM 9.0 9.6   Lipid Panel:     Component Value Date/Time   CHOL 248 (H) 07/12/2018 2253   TRIG 89 07/03/2018 2253   HDL 59 06/23/2018 2253   CHOLHDL 4.2 07/06/2018 2253   VLDL 18 06/26/2018 2253   LDLCALC 171 (H) 07/02/2018 2253   HgbA1c:  Lab Results  Component Value Date   HGBA1C 5.7 (H) 07/02/2018   Urine Drug  Screen:     Component Value Date/Time   LABOPIA NONE DETECTED 06/25/2018 1930   COCAINSCRNUR NONE DETECTED 07/10/2018 1930   LABBENZ NONE DETECTED 06/28/2018 1930   AMPHETMU NONE DETECTED 07/17/2018 1930   THCU NONE DETECTED 06/26/2018 1930   LABBARB (A) 07/21/2018 1930    Result not available. Reagent lot number recalled by manufacturer.    Alcohol Level     Component Value Date/Time   ETH <10 07/20/2018 1814    IMAGING Ct Angio Head W Or Wo Contrast  Result Date: 07/13/2018 CLINICAL DATA:  84 y/o  F; slurred speech and right-sided deficits. EXAM: CT ANGIOGRAPHY HEAD AND NECK CT PERFUSION BRAIN TECHNIQUE: Multidetector CT imaging of the head and neck was performed using the standard protocol during bolus administration of intravenous contrast. Multiplanar CT image reconstructions and MIPs were obtained to evaluate the vascular anatomy. Carotid stenosis measurements (when applicable) are obtained utilizing NASCET criteria, using the distal internal carotid diameter as the denominator. Multiphase CT imaging of the brain was performed following IV bolus contrast injection. Subsequent parametric perfusion maps were calculated using RAPID software. CONTRAST:  150m ISOVUE-370 IOPAMIDOL (ISOVUE-370) INJECTION 76% COMPARISON:  07/04/2018 CT head. FINDINGS: CTA NECK FINDINGS Aortic arch: Standard branching. Imaged portion  shows no evidence of aneurysm or dissection. Left subclavian artery origin and downstream tandem segments of 50% moderate stenosis with predominant fibrofatty plaque. Moderate mixed plaque of the aortic arch. Right carotid system: No evidence of dissection, stenosis (50% or greater) or occlusion. Calcified plaque of the carotid bifurcation with mild less than 50% proximal ICA stenosis. Left carotid system: No evidence of dissection, stenosis (50% or greater) or occlusion. Calcified plaque of the carotid bifurcation with mild less 50% proximal ICA stenosis. Vertebral arteries: Widely  patent right dominant vertebral artery. Proximal left vertebral artery is patent with several segments of stenosis. The downstream vessel as a gradient decrease in attenuation with occlusion at the V3 segment. The left vertebral artery between basilar PICA is patent although diminutive. Skeleton: Moderate cervical spondylosis with multilevel disc and facet degenerative changes. Grade 1 C4-5 anterolisthesis. No high-grade bony canal stenosis. Other neck: Subcentimeter thyroid nodules. Upper chest: Mild biapical pleuroparenchymal scarring. Review of the MIP images confirms the above findings CTA HEAD FINDINGS Anterior circulation: Calcified plaque of the carotid siphons with moderate 50% bilateral paraclinoid stenosis. No large vessel occlusion, aneurysm, or additional segment of high-grade stenosis. Posterior circulation: Patent right vertebral artery. Occluded left V3 and V4 to the PICA origin. The left vertebral artery between the vertebrobasilar junction and PICA origin is patent and diminutive. Basilar artery is patent with lumen irregularity of multiple segments of mild stenosis. Patent bilateral PCA. Right distal P2 segment of moderate stenosis and bilateral proximal P2 segments of mild stenosis. Venous sinuses: As permitted by contrast timing, patent. Anatomic variants: Fetal right PCA. Probable small anterior communicating artery. No left posterior communicating artery identified, likely hypoplastic or absent. Review of the MIP images confirms the above findings CT Brain Perfusion Findings: CBF (<30%) Volume: 2m Perfusion (Tmax>6.0s) volume: 04mMismatch Volume: 33m62mnfarction Location:Negative. IMPRESSION: 1. Normal CT brain perfusion. 2. 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 Patent diminutive left vertebral artery between vertebrobasilar junction and left PICA origin. 3. Patent carotid systems and right vertebral artery without dissection or high-grade  stenosis. 4. Patent circle-of-Willis, bilateral ACA, bilateral MCA, and bilateral PCA. No large vessel occlusion or aneurysm. 5. Calcified plaque of carotid bifurcations with bilateral proximal ICA mild less 50% stenosis. 6. Calcified plaque of carotid siphons with moderate bilateral paraclinoid stenosis. These results were called by telephone at the time of interpretation on 07/16/2018 at 6:21 pm to Dr. MCNRoland Rackwho verbally acknowledged these results. Electronically Signed   By: LanKristine GarbeD.   On: 07/01/2018 18:21   Ct Angio Neck W Or Wo Contrast  Result Date: 07/07/2018 CLINICAL DATA:  [age]o  F; slurred speech and right-sided deficits. EXAM: CT ANGIOGRAPHY HEAD AND NECK CT PERFUSION BRAIN TECHNIQUE: Multidetector CT imaging of the head and neck was performed using the standard protocol during bolus administration of intravenous contrast. Multiplanar CT image reconstructions and MIPs were obtained to evaluate the vascular anatomy. Carotid stenosis measurements (when applicable) are obtained utilizing NASCET criteria, using the distal internal carotid diameter as the denominator. Multiphase CT imaging of the brain was performed following IV bolus contrast injection. Subsequent parametric perfusion maps were calculated using RAPID software. CONTRAST:  1033m63mOVUE-370 IOPAMIDOL (ISOVUE-370) INJECTION 76% COMPARISON:  07/22/2018 CT head. FINDINGS: CTA NECK FINDINGS Aortic arch: Standard branching. Imaged portion shows no evidence of aneurysm or dissection. Left subclavian artery origin and downstream tandem segments of 50% moderate stenosis with predominant fibrofatty plaque. Moderate mixed plaque of the aortic  arch. Right carotid system: No evidence of dissection, stenosis (50% or greater) or occlusion. Calcified plaque of the carotid bifurcation with mild less than 50% proximal ICA stenosis. Left carotid system: No evidence of dissection, stenosis (50% or greater) or occlusion.  Calcified plaque of the carotid bifurcation with mild less 50% proximal ICA stenosis. Vertebral arteries: Widely patent right dominant vertebral artery. Proximal left vertebral artery is patent with several segments of stenosis. The downstream vessel as a gradient decrease in attenuation with occlusion at the V3 segment. The left vertebral artery between basilar PICA is patent although diminutive. Skeleton: Moderate cervical spondylosis with multilevel disc and facet degenerative changes. Grade 1 C4-5 anterolisthesis. No high-grade bony canal stenosis. Other neck: Subcentimeter thyroid nodules. Upper chest: Mild biapical pleuroparenchymal scarring. Review of the MIP images confirms the above findings CTA HEAD FINDINGS Anterior circulation: Calcified plaque of the carotid siphons with moderate 50% bilateral paraclinoid stenosis. No large vessel occlusion, aneurysm, or additional segment of high-grade stenosis. Posterior circulation: Patent right vertebral artery. Occluded left V3 and V4 to the PICA origin. The left vertebral artery between the vertebrobasilar junction and PICA origin is patent and diminutive. Basilar artery is patent with lumen irregularity of multiple segments of mild stenosis. Patent bilateral PCA. Right distal P2 segment of moderate stenosis and bilateral proximal P2 segments of mild stenosis. Venous sinuses: As permitted by contrast timing, patent. Anatomic variants: Fetal right PCA. Probable small anterior communicating artery. No left posterior communicating artery identified, likely hypoplastic or absent. Review of the MIP images confirms the above findings CT Brain Perfusion Findings: CBF (<30%) Volume: 12m Perfusion (Tmax>6.0s) volume: 039mMismatch Volume: 3m67mnfarction Location:Negative. IMPRESSION: 1. Normal CT brain perfusion. 2. 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 Patent diminutive left vertebral artery between vertebrobasilar junction  and left PICA origin. 3. Patent carotid systems and right vertebral artery without dissection or high-grade stenosis. 4. Patent circle-of-Willis, bilateral ACA, bilateral MCA, and bilateral PCA. No large vessel occlusion or aneurysm. 5. Calcified plaque of carotid bifurcations with bilateral proximal ICA mild less 50% stenosis. 6. Calcified plaque of carotid siphons with moderate bilateral paraclinoid stenosis. These results were called by telephone at the time of interpretation on 07/05/2018 at 6:21 pm to Dr. MCNRoland Rackwho verbally acknowledged these results. Electronically Signed   By: LanKristine GarbeD.   On: 07/13/2018 18:21   Mr Brain Wo Contrast  Result Date: 07/14/2018 CLINICAL DATA:  [age] F; slurred speech, right facial droop, right-sided weakness. EXAM: MRI HEAD WITHOUT CONTRAST MRA HEAD WITHOUT CONTRAST TECHNIQUE: Multiplanar, multiecho pulse sequences of the brain and surrounding structures were obtained without intravenous contrast. Angiographic images of the head were obtained using MRA technique without contrast. COMPARISON:  07/15/2018 CT head, CT perfusion head, CT angiogram head. FINDINGS: MRI HEAD FINDINGS Brain: Linear reduced diffusion within the left paramedian pons compatible with acute/early subacute infarction and corresponding to hypoattenuation on CT (series 6 image 10-14). No associated hemorrhage or mass effect. Stable mild chronic microvascular ischemic changes and parenchymal volume loss of the brain for age. No abnormal susceptibility hypointensity to indicate intracranial hemorrhage. No extra-axial collection, hydrocephalus, focal mass effect, or herniation. Vascular: As below. Skull and upper cervical spine: Normal marrow signal. Sinuses/Orbits: Negative. Other: None. MRA HEAD FINDINGS Internal carotid arteries: Irregularity of bilateral carotid siphons with low signal calcified plaque with moderate bilateral paraclinoid stenosis. Anterior cerebral arteries:   Patent. Middle cerebral arteries: Patent. Anterior communicating artery: Patent. Posterior communicating arteries:  Fetal right PCA. No left posterior communicating artery identified, likely hypoplastic or absent. Posterior cerebral arteries:  Patent. Basilar artery: Diffuse irregularity with multiple segments of mild stenosis. Vertebral arteries: Patent right vertebral artery. Diminutive left vertebral artery between the vertebrobasilar junction and left PICA origin. No appreciable vertebral artery below the level of the PICA, better characterized on prior CT angiogram. IMPRESSION: 1. Left paramedian pontine acute/early subacute infarction. No associated hemorrhage or mass effect. 2. 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 No additional intracranial large vessel occlusion. 3. Stable chronic microvascular ischemic changes and parenchymal volume loss of the brain. These results were called by telephone at the time of interpretation on 07/12/2018 at 9:08 pm to Dr. Nanda Quinton , who verbally acknowledged these results. Electronically Signed   By: Kristine Garbe M.D.   On: 06/30/2018 21:11   Ct Cerebral Perfusion W Contrast  Result Date: 06/28/2018 CLINICAL DATA:  84 y/o  F; slurred speech and right-sided deficits. EXAM: CT ANGIOGRAPHY HEAD AND NECK CT PERFUSION BRAIN TECHNIQUE: Multidetector CT imaging of the head and neck was performed using the standard protocol during bolus administration of intravenous contrast. Multiplanar CT image reconstructions and MIPs were obtained to evaluate the vascular anatomy. Carotid stenosis measurements (when applicable) are obtained utilizing NASCET criteria, using the distal internal carotid diameter as the denominator. Multiphase CT imaging of the brain was performed following IV bolus contrast injection. Subsequent parametric perfusion maps were calculated using RAPID software.  CONTRAST:  152m ISOVUE-370 IOPAMIDOL (ISOVUE-370) INJECTION 76% COMPARISON:  07/04/2018 CT head. FINDINGS: CTA NECK FINDINGS Aortic arch: Standard branching. Imaged portion shows no evidence of aneurysm or dissection. Left subclavian artery origin and downstream tandem segments of 50% moderate stenosis with predominant fibrofatty plaque. Moderate mixed plaque of the aortic arch. Right carotid system: No evidence of dissection, stenosis (50% or greater) or occlusion. Calcified plaque of the carotid bifurcation with mild less than 50% proximal ICA stenosis. Left carotid system: No evidence of dissection, stenosis (50% or greater) or occlusion. Calcified plaque of the carotid bifurcation with mild less 50% proximal ICA stenosis. Vertebral arteries: Widely patent right dominant vertebral artery. Proximal left vertebral artery is patent with several segments of stenosis. The downstream vessel as a gradient decrease in attenuation with occlusion at the V3 segment. The left vertebral artery between basilar PICA is patent although diminutive. Skeleton: Moderate cervical spondylosis with multilevel disc and facet degenerative changes. Grade 1 C4-5 anterolisthesis. No high-grade bony canal stenosis. Other neck: Subcentimeter thyroid nodules. Upper chest: Mild biapical pleuroparenchymal scarring. Review of the MIP images confirms the above findings CTA HEAD FINDINGS Anterior circulation: Calcified plaque of the carotid siphons with moderate 50% bilateral paraclinoid stenosis. No large vessel occlusion, aneurysm, or additional segment of high-grade stenosis. Posterior circulation: Patent right vertebral artery. Occluded left V3 and V4 to the PICA origin. The left vertebral artery between the vertebrobasilar junction and PICA origin is patent and diminutive. Basilar artery is patent with lumen irregularity of multiple segments of mild stenosis. Patent bilateral PCA. Right distal P2 segment of moderate stenosis and bilateral  proximal P2 segments of mild stenosis. Venous sinuses: As permitted by contrast timing, patent. Anatomic variants: Fetal right PCA. Probable small anterior communicating artery. No left posterior communicating artery identified, likely hypoplastic or absent. Review of the MIP images confirms the above findings CT Brain Perfusion Findings: CBF (<30%) Volume: 042mPerfusion (Tmax>6.0s) volume: 51m102mismatch Volume: 51mL351mfarction Location:Negative. IMPRESSION:  1. Normal CT brain perfusion. 2. 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 Patent diminutive left vertebral artery between vertebrobasilar junction and left PICA origin. 3. Patent carotid systems and right vertebral artery without dissection or high-grade stenosis. 4. Patent circle-of-Willis, bilateral ACA, bilateral MCA, and bilateral PCA. No large vessel occlusion or aneurysm. 5. Calcified plaque of carotid bifurcations with bilateral proximal ICA mild less 50% stenosis. 6. Calcified plaque of carotid siphons with moderate bilateral paraclinoid stenosis. These results were called by telephone at the time of interpretation on 07/03/2018 at 6:21 pm to Dr. Roland Rack , who verbally acknowledged these results. Electronically Signed   By: Kristine Garbe M.D.   On: 07/20/2018 18:21   Mr Jodene Nam Head Wo Contrast  Result Date: 06/26/2018 CLINICAL DATA:  84 y/o F; slurred speech, right facial droop, right-sided weakness. EXAM: MRI HEAD WITHOUT CONTRAST MRA HEAD WITHOUT CONTRAST TECHNIQUE: Multiplanar, multiecho pulse sequences of the brain and surrounding structures were obtained without intravenous contrast. Angiographic images of the head were obtained using MRA technique without contrast. COMPARISON:  06/24/2018 CT head, CT perfusion head, CT angiogram head. FINDINGS: MRI HEAD FINDINGS Brain: Linear reduced diffusion within the left paramedian pons compatible with acute/early subacute infarction and  corresponding to hypoattenuation on CT (series 6 image 10-14). No associated hemorrhage or mass effect. Stable mild chronic microvascular ischemic changes and parenchymal volume loss of the brain for age. No abnormal susceptibility hypointensity to indicate intracranial hemorrhage. No extra-axial collection, hydrocephalus, focal mass effect, or herniation. Vascular: As below. Skull and upper cervical spine: Normal marrow signal. Sinuses/Orbits: Negative. Other: None. MRA HEAD FINDINGS Internal carotid arteries: Irregularity of bilateral carotid siphons with low signal calcified plaque with moderate bilateral paraclinoid stenosis. Anterior cerebral arteries:  Patent. Middle cerebral arteries: Patent. Anterior communicating artery: Patent. Posterior communicating arteries: Fetal right PCA. No left posterior communicating artery identified, likely hypoplastic or absent. Posterior cerebral arteries:  Patent. Basilar artery: Diffuse irregularity with multiple segments of mild stenosis. Vertebral arteries: Patent right vertebral artery. Diminutive left vertebral artery between the vertebrobasilar junction and left PICA origin. No appreciable vertebral artery below the level of the PICA, better characterized on prior CT angiogram. IMPRESSION: 1. Left paramedian pontine acute/early subacute infarction. No associated hemorrhage or mass effect. 2. 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 No additional intracranial large vessel occlusion. 3. Stable chronic microvascular ischemic changes and parenchymal volume loss of the brain. These results were called by telephone at the time of interpretation on 07/05/2018 at 9:08 pm to Dr. Nanda Quinton , who verbally acknowledged these results. Electronically Signed   By: Kristine Garbe M.D.   On: 07/15/2018 21:11   Ct Head Code Stroke Wo Contrast  Result Date: 07/16/2018 CLINICAL DATA:  Code  stroke. Right-sided facial droop and slurred speech. EXAM: CT HEAD WITHOUT CONTRAST TECHNIQUE: Contiguous axial images were obtained from the base of the skull through the vertex without intravenous contrast. COMPARISON:  None. FINDINGS: Brain: Linear lucency in left hemi pons may represent a pontine perforator infarction, age indeterminate. No additional acute infarction identified. No hemorrhage, extra-axial collection, hydrocephalus, or herniation. Mild for age chronic microvascular ischemic changes and parenchymal volume loss of the brain. Small chronic lacunar infarct in the right putamen. Vascular: Calcific atherosclerosis of the carotid siphons. No hyperdense vessel identified. Skull: Normal. Negative for fracture or focal lesion. Sinuses/Orbits: No acute finding. Other: None. ASPECTS [facility] Stroke  Program Early CT Score) - Ganglionic level infarction (caudate, lentiform nuclei, internal capsule, insula, M1-M3 cortex): 7 - Supraganglionic infarction (M4-M6 cortex): 3 Total score (0-10 with 10 being normal): 10 IMPRESSION: 1. Linear lucency in left paramedian pons compatible with pontine perforator infarct, age indeterminate. 2. Mild chronic microvascular ischemic changes and parenchymal volume loss of the brain for age. 3. ASPECTS is 10 These results were called by telephone at the time of interpretation on 07/16/2018 at 5:37 pm to Dr. Leonel Ramsay, who verbally acknowledged these results. Electronically Signed   By: Kristine Garbe M.D.   On: 07/11/2018 17:39    PHYSICAL EXAM  Frail elderly Caucasian lady currently not in distress. . Afebrile. Head is nontraumatic. Neck is supple without bruit.    Cardiac exam no murmur or gallop. Lungs are clear to auscultation. Distal pulses are well felt. Neurological Exam :  Awake alert oriented 3 normal speech and language function. Diminished attention, registration and recall. Extraocular moments are full range without nystagmus. Blinks to threat  bilaterally. Fundi not visualized. Mild right lower facial weakness. Tongue midline. Motor system exam shows right hemiplegia with right upper extremity grade 1-2/5 strength. Right lower extremity strength is 3/5. Tone is diminished on the right compared to the left. Sensation is slightly impaired on the right compared to the left. Deep tendon reflexes are depressed on the right and present on the left. Right plantar upgoing left downgoing. Gait not tested.  ASSESSMENT/PLAN [name] is a 85 y.o. female with history of HTN, TIA July 2019, [facility] June 2018 presenting with right hemiparesis.   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  Code Stroke CT head No acute stroke. Old left paramedian pontine infacrt Small vessel disease.  Atrophy. ASPECTS 10.   CTA head & neck: occlusion of left vertebral artery V3 and proximal V4 age indeterminate, B ICA < 50%  CT perfusion Normal brain perfusion  MRI  Left paramedian pontine acute/ early subacute infarct, no hemorrhage,  MRA  Occluded Left VA   2D Echo  pending  LDL 171  HgbA1c 5.7  SCDs for VTE prophylaxis  No antithrombotic prior to admission (had been on aspirin 81 mg daily prior to admission in June in [facility] for a [place]. [place] cleared as of 06/26/18 on CT done at [facility] [place]. Pt now on aspirin 300 mg suppository daily.   Therapy recommendations:  pending . Used walker since 06/14/2018 admission. Had [facility] PT at home. SLP and OT planned but never got there. Likely will need IP rehab this admission  Disposition:  pending . Has been staying with her daughter since 06/14/2018 hospital admission.  Dyspphagia  Secondary to stroke  Diet Order                 Diet NPO time specified  Diet effective now          SLP to assess  For MBSS  today  Hypertension  Stable . Permissive hypertension (OK if < 220/120) but gradually normalize in 5-7 days . Long-term BP goal normotensive  Hyperlipidemia  Home meds:  none,   LDL 171, goal < 70  Add statin once able to swallow  Other Stroke Risk Factors  Advanced age  Hx stroke/TIA  06/26/2018 - TIA, previous [facility]H seen now resolved  06/14/2018 [facility] R frontal cortical  Other Active Problems  Right eye pain Add topamax 25 mg bid ( check ESR)-pending  CKD stage 3  Hospital day # 1  Burnetta Sabin, MSN, APRN, ANVP-BC, AGPCNP-BC Advanced Practice Stroke Nurse [place] for Schedule & Pager information 06/30/2018 11:16 AM  I have personally examined this patient, reviewed notes, independently viewed imaging studies, participated in medical decision making and plan of care.ROS completed by me personally and pertinent positives fully documented  I have made any additions or clarifications directly to the above note. Agree with note above.  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 She recently had a nontraumatic right frontal subarachnoid hemorrhage 2 weeks ago and aspirin was stopped. A week ago she had a possible TIA and CT scan at that visit showed complete resolution of subarachnoid hemorrhage. She remains at risk for recurrent strokes and will benefit with starting antiplatelet therapy with aspirin. Long discussion with the patient and daughter about risk-benefit of bleeding with aspirin and my recommendation at the present time is to start aspirin. Continue ongoing stroke workup. Greater than 50% time during this 35 minute visit was spent on counseling and coordination of care about her lacunar stroke, recent subarachnoid hemorrhage and answering questions.  Antony Contras, MD Medical Director [facility] Pager: [phone] 06/30/2018 3:25 PM  To contact Stroke Continuity provider,  please refer to [email or web address] After hours, contact General Neurology

## 2018-06-30 NOTE — Progress Notes (Signed)
Modified Barium Swallow Progress Note  Patient Details  Name: Gina Boone MRN: [id number] [id number] Date of Birth: 23-Jan-1929  Today's Date: 06/30/2018  Modified Barium Swallow completed.  Full report located under Chart Review in the Imaging Section.  Brief recommendations include the following:  Clinical Impression  Pt demonstrates a severe pharyngeal dysphagia following brainstem CVA; limited study due to severity, only three sips given. Deficits include delayed swallow initiation and airway protection, decreased base of tongue retraction and pharyngeal persitalsis, particulary of the upper pharyngeal constrictor. There is aspiration of nectar thick liquids before the swallow with sensation and severe residue in the upper oropharynx with oral and nasal regurgitation and subsequent aspiration of spilling residue.  Strongly suspect weakness of lateral pharyngeal wall, possibly on the right with potential for paresis/paralysis of unilateral arytenoid.  Trialed a head turn left with increased partial peristalsis of bolus, though severe aspiration of residue continued to occur. Pt could not tolerate further trials.   Pt at high risk of aspiration with all PO and of secretions. Plan to target strength of cough and pharyngeal strength in trial of acute therapy, though potential for improvement in the short term is guarded. Would recommend f/u FEES for direct visualization of ROM of oropharyngeal musculature.    Swallow Evaluation Recommendations       SLP Diet Recommendations: NPO;Alternative means - temporary                       Oral Care Recommendations: Oral care QID        Gina Boone, Gina Boone 06/30/2018,3:12 PM

## 2018-06-30 NOTE — Progress Notes (Signed)
Pt transported off unit for Barium Swallow. Dionne BucyP. Amo Quindell Shere RN

## 2018-06-30 NOTE — Evaluation (Signed)
Speech Language Pathology Evaluation Patient Details Name: Gina Boone MRN: [id number] DOB: 1929-10-29 Today's Date: 06/30/2018 Time: [id number] SLP Time Calculation (min) (ACUTE ONLY): 20 min  Problem List:  Patient Active Problem List   Diagnosis Date Noted  . Cerebral thrombosis with cerebral infarction 06/30/2018   Past Medical History:  Past Medical History:  Diagnosis Date  . Hypertension   . Stroke [facility](HCC)    Past Surgical History:  Past Surgical History:  Procedure Laterality Date  . renal stents     HPI:  Gina Boone is a 84 y.o. female with a history of recent subarachnoid hemorrhage who presents with Right-sided weakness.  Found to have infarct in left paramedian pons.     Assessment / Plan / Recommendation Clinical Impression  Pt demonstrates a moderate dysarthria with lingual and facial weakness and low vocal intensity. Introduced concepts of increased volume with larger breath, chunking and overarticulation which pt return demonstrated with moderate verbal cues and modeling. Will need f/u for speech intelligibilty. Would recommend CIR if appropriate otherwise.     SLP Assessment  SLP Recommendation/Assessment: Patient needs continued Speech Lanaguage Pathology Services SLP Visit Diagnosis: Dysarthria and anarthria (R47.1)    Follow Up Recommendations  Inpatient Rehab    Frequency and Duration min 2x/week  2 weeks      SLP Evaluation Cognition  Overall Cognitive Status: Within Functional Limits for tasks assessed Orientation Level: Oriented to person;Oriented to place;Oriented to situation;Disoriented to time       Comprehension  Auditory Comprehension Overall Auditory Comprehension: Appears within functional limits for tasks assessed    Expression Verbal Expression Overall Verbal Expression: Appears within functional limits for tasks assessed   Oral / Motor  Oral Motor/Sensory Function Overall Oral Motor/Sensory Function: Mild impairment Facial ROM:  Reduced right;Suspected CN VII (facial) dysfunction Facial Symmetry: Abnormal symmetry right;Suspected CN VII (facial) dysfunction Facial Strength: Within Functional Limits Lingual ROM: Reduced right;Suspected CN XII (hypoglossal) dysfunction Lingual Symmetry: Abnormal symmetry right;Suspected CN XII (hypoglossal) dysfunction Lingual Strength: Reduced;Suspected CN XII (hypoglossal) dysfunction Motor Speech Overall Motor Speech: Impaired Respiration: Impaired Level of Impairment: Word Phonation: Low vocal intensity Resonance: Within functional limits Articulation: Impaired Level of Impairment: Word Intelligibility: Intelligibility reduced Word: 25-49% accurate Phrase: 25-49% accurate Sentence: 25-49% accurate Conversation: 25-49% accurate Motor Planning: Witnin functional limits Motor Speech Errors: Aware;Consistent Effective Techniques: Over-articulate;Increased vocal intensity   GO                   Harlon DittyBonnie Brinly Maietta, MA CCC-SLP [phone]  Claudine MoutonDeBlois, [name] [name] 06/30/2018, 11:41 AM

## 2018-06-30 NOTE — Progress Notes (Signed)
Pt back to the unit from procedure. Gina BucyP. Amo Gina Maeda RN

## 2018-06-30 NOTE — Progress Notes (Signed)
Subjective: [name] was lying in bed on visit today. She is barely able to speak and very difficult to understand. She communicated mostly through nodding her head yes or no. She had no pain today besides eye pain that occurs intermittently at random and only lasts for 1-2  Minutes. The pain is not exacerbated by light and there is no change in vision. She believed she felt her symptoms had somewhat improved from yesterday and denied HA or  Numbness. She has had a new onset of cough but denies SOB of dyspnea.  We spoke with her and her daughter concerning her high risk of aspiration and failure of her swallow study. We discussed the different options they will need to think about if she fails her barium swallow and is unable to drink water or eat food.   Objective:  Vital signs in last 24 hours: Vitals:   06/30/18 0530 06/30/18 0800 06/30/18 1154 06/30/18 1622  BP: (!) 183/79 (!) 195/81 (!) 203/72 (!) 194/74  Pulse:  85 81 96  Resp:  19 20 17   Temp: 98.1 F (36.7 C)  97.8 F (36.6 C) 98.3 F (36.8 C)  TempSrc: Oral  Axillary Axillary  SpO2:  98% 98% 95%  Weight:      Height:       Physical Exam:  Constitution: patient lying supine in bed, appears frail, she is very hard to understand with significant dysarthria,  HENT: normocephalic, atraumatic, +hearing b/l Eyes: EOM intact, right lid droop, injection of right eye with watering Cardio: regular rate & rhythm, normal heart sounds, +radial pulse bilaterally  Respiratory: decreased breath sounds on left, crackles left, CTA on right Abdominal: non-distended, +bs, NTTP Neuro: Strength 0/5 RUE & LUE, 5/5 LUE & RUE; no loss of sensation UE & LE bilaterally CN II-IV, VI, VIII, intact. CN V right facial droop CN IX difficulty swallowing CN XI 4/5 right rotation of head and right shoulder shrug; 5/5 on left CN XII unable to assess clearly Skin: dry and intact  Assessment/Plan:  Active Problems:   Cerebral thrombosis with cerebral  infarction  [name] with recent history of probable SAH 6/23, TIA one week ago who presented 7/9 with right sided weakness in the UE & LE & right facial droop.   #CVA: Patient has no discernable improvement in symptoms today as she has no movement of her right left arm or leg. She continues to have right sided facial droop and RT determined she is severe aspiration risk through assessment and objective barium swallow study today, 7/9. [facility] CT [facility], MRI showed left vertebral artery occlusion and left pontine infarct. Echo showed LVEF 55-60%  - Neurology on board  - asa restarted - 300mg  qd suppository - NPO - Pt failed barium swallow study - discussed option of peg tube and goals of care if she shows no improvement - D5 .45% 2375mL/hr - continue allowing for permissive HTN, systolic <222 and diastolic <120 - RT recommends inpatient rehab - PT/OT consulted   #Cough secondary to aspiration: CXR 7/9 demonstrates barium aspirated into the right lung  - discuss use of EMST with RT - monitor - patient high risk for aspiration pneumonia  #CKD stage III: History of CKD. Cr 1.27 today (1.42 yesterday)  - repeat BMP  #Hypokalemic: 3.2 (3.5 yesterday) - 10mEq K IV x 4 - Repeat BMP - am Mg  #Hyperlipidemia: lipid panel cholest 248, LDL 171 - holding niacin   Dispo: Anticipated  discharge in approximately 2 days.   Versie Starks, DO 06/30/2018, 4:43 PM Pager: [phone]

## 2018-06-30 NOTE — Progress Notes (Signed)
Patient seems alert and oriented but she is still having a hard time talking, it is very difficult for me to understand her, right side is still very weak, will continue to monitor.

## 2018-06-30 NOTE — Evaluation (Signed)
Clinical/Bedside Swallow Evaluation Patient Details  Name: Gina Boone MRN: [id number] Date of Birth: June 22, 1929  Today's Date: 06/30/2018 Time: SLP Start Time (ACUTE ONLY): 1050 SLP Stop Time (ACUTE ONLY): 1105 SLP Time Calculation (min) (ACUTE ONLY): 15 min  Past Medical History:  Past Medical History:  Diagnosis Date  . Hypertension   . Stroke [facility](HCC)    Past Surgical History:  Past Surgical History:  Procedure Laterality Date  . renal stents     HPI:  Gina Boone is a 84 y.o. female with a history of recent subarachnoid hemorrhage who presents with Right-sided weakness.  Found to have infarct in left paramedian pons.     Assessment / Plan / Recommendation Clinical Impression  Pt demonstrates signs of pharyngeal dysphagia with immediate coughing and wet vocal quality with minimal trials of water. Oral phase relatively good despite right sided weakness. Will proceed to objective testing to determine ability to consume modified diet and direct therapy. Pt and daughter in agreement.  SLP Visit Diagnosis: Dysphagia, oropharyngeal phase (R13.12)    Aspiration Risk  Severe aspiration risk    Diet Recommendation NPO        Other  Recommendations Oral Care Recommendations: Oral care BID   Follow up Recommendations Inpatient Rehab      Frequency and Duration min 2x/week  2 weeks       Prognosis Prognosis for Safe Diet Advancement: Fair      Swallow Study   General HPI: Gina Boone is a 84 y.o. female with a history of recent subarachnoid hemorrhage who presents with Right-sided weakness.  Found to have infarct in left paramedian pons.   Type of Study: Bedside Swallow Evaluation Diet Prior to this Study: NPO Temperature Spikes Noted: No History of Recent Intubation: No Behavior/Cognition: Alert;Cooperative Oral Cavity Assessment: Dry Oral Care Completed by SLP: Yes Oral Cavity - Dentition: Adequate natural dentition Vision: Functional for self-feeding Self-Feeding  Abilities: Able to feed self;Needs assist Patient Positioning: Upright in bed Baseline Vocal Quality: Low vocal intensity Volitional Cough: Weak Volitional Swallow: Able to elicit    Oral/Motor/Sensory Function Overall Oral Motor/Sensory Function: Mild impairment Facial ROM: Reduced right;Suspected CN VII (facial) dysfunction Facial Symmetry: Abnormal symmetry right;Suspected CN VII (facial) dysfunction Facial Strength: Within Functional Limits Lingual ROM: Reduced right;Suspected CN XII (hypoglossal) dysfunction Lingual Symmetry: Abnormal symmetry right;Suspected CN XII (hypoglossal) dysfunction Lingual Strength: Reduced;Suspected CN XII (hypoglossal) dysfunction   Ice Chips Ice chips: Impaired Presentation: Spoon Oral Phase Functional Implications: Prolonged oral transit Pharyngeal Phase Impairments: Suspected delayed Swallow;Decreased hyoid-laryngeal movement;Multiple swallows;Wet Vocal Quality;Throat Clearing - Immediate   Thin Liquid Thin Liquid: Impaired Presentation: Cup;Self Fed Pharyngeal  Phase Impairments: Suspected delayed Swallow;Decreased hyoid-laryngeal movement;Multiple swallows;Wet Vocal Quality;Throat Clearing - Immediate;Cough - Immediate    Nectar Thick Nectar Thick Liquid: Not tested   Honey Thick Honey Thick Liquid: Not tested   Puree Puree: Not tested   Solid   GO   Solid: Not tested       Harlon DittyBonnie Darnise Montag, MA CCC-SLP [phone]  Calandra Madura, Riley NearingBonnie [name] 06/30/2018,11:37 AM

## 2018-06-30 NOTE — Progress Notes (Signed)
  Echocardiogram 2D Echocardiogram has been performed.  Gina Boone 06/30/2018, 11:48 AM

## 2018-06-30 NOTE — Progress Notes (Signed)
Internal Medicine Attending  Date: 06/30/2018  Patient name: Gina Boone Medical record number: [id number] Date of birth: 1929-04-19 Age: 84 y.o. Gender: female  I saw and evaluated the patient. I reviewed the resident's note by Dr. Cleaster CorinSeawell and I agree with the resident's findings and plans as documented in her progress note.  Please see my H&P dated 06/30/2018 and attached to Dr. Sylvester HarderKrienke's H&P dated 2018-05-13 for the specifics of my evaluation, assessment, and plan from earlier in the day.

## 2018-06-30 NOTE — Evaluation (Signed)
Occupational Therapy Evaluation Patient Details Name: Gina Boone MRN: [id number] DOB: 23-Apr-1929 Today's Date: 06/30/2018    History of Present Illness 84 y.o. female with a history of recent subarachnoid hemorrhage who presents with Right-sided weakness.  Found to have infarct in left paramedian pons   Clinical Impression   Pt admitted with above. She demonstrates the below listed deficits and will benefit from continued OT to maximize safety and independence with BADLs.  Pt limited to bed level activity due to BP supine 194/115.  Pt presents to OT with Rt hemiplegia - she does demonstrate 1/5 strength Rt UE, impaired balance, decreased trunk control, impaired visual pursuits, decreased functional use of Rt UE.  She currently requires max - total A for ADLs.  PTA, she lived alone and was fully independent.  She was tearful during eval.   Feel she will benefit from CIR to allow her to maximize safety and independence with ADLs, reduce burden of care, and reduce risk of readmission.       Follow Up Recommendations  CIR;Supervision/Assistance - 24 hour    Equipment Recommendations  None recommended by OT    Recommendations for Other Services Rehab consult     Precautions / Restrictions Precautions Precautions: Fall      [email or web address]               General transfer comment: unable to attempt due to BP 194/115    Balance                              ADL either performed or assessed with clinical judgement   ADL Overall ADL's : Needs assistance/impaired Eating/Feeding: NPO   Grooming: Wash/dry hands;Wash/dry face;Oral care;Brushing hair;Minimal assistance;Bed level   Upper Body Bathing: Moderate assistance;Bed level   Lower Body Bathing: Total assistance;Bed level   Upper Body Dressing : Total assistance;Bed level   Lower Body Dressing: Total assistance;Bed level   Toilet Transfer: Total assistance Toilet Transfer Details (indicate cue type and reason):  unable to safely assist  Toileting- Clothing Manipulation and Hygiene: Total assistance;Bed level       Functional mobility during ADLs: Maximal assistance       Vision Baseline Vision/History: Wears glasses Wears Glasses: At all times Patient Visual Report: No change from baseline Vision Assessment?: Yes Eye Alignment: Within Functional Limits Ocular Range of Motion: Other (comment) Tracking/Visual Pursuits: Other (comment) Additional Comments: Pt loses fixation frequently with superior pursuits.  Pt fatigued with visual testing.  She reports vision feels clear and denies diplopia or blurriness      Perception Perception Perception Tested?: Yes   Praxis Praxis Praxis tested?: Within functional limits    Pertinent Vitals/Pain Pain Assessment: Faces Faces Pain Scale: Hurts a little bit Pain Location: generalized  Pain Descriptors / Indicators: Discomfort;Guarding Pain Intervention(s): Monitored during session     Hand Dominance Right   Extremity/Trunk Assessment Upper Extremity Assessment Upper Extremity Assessment: RUE deficits/detail RUE Deficits / Details: Pt with 1/5 shoulder flex/ext, elbow flex/ext, and finger flex.  PROM WFL  RUE Coordination: decreased fine motor;decreased gross motor      Cervical / Trunk Assessment Cervical / Trunk Assessment: Kyphotic;Other exceptions(decreased trunk control )   Communication Communication Communication: Expressive difficulties(dyarthria )   Cognition Arousal/Alertness: Awake/alert Behavior During Therapy: WFL for tasks assessed/performed Overall Cognitive Status: Within Functional Limits for tasks assessed  General Comments: Pt tearful    General Comments  BP supine 194/115 - RN notified.  DOE 3/4 with minimal activity    Exercises     Shoulder Instructions      Home Living Family/patient expects to be discharged to:: Private residence   Available Help at  Discharge: Family Type of Home: House Home Access: Ramped entrance     Home Layout: One level     Bathroom Shower/Tub: Chief Strategy Officer: Standard                Prior Functioning/Environment Level of Independence: Independent                 OT Problem List: Decreased strength;Decreased range of motion;Decreased activity tolerance;Impaired balance (sitting and/or standing);Impaired vision/perception;Decreased coordination;Decreased safety awareness;Decreased knowledge of use of DME or AE;Cardiopulmonary status limiting activity;Impaired tone;Impaired UE functional use      OT Treatment/Interventions: Self-care/ADL training;Neuromuscular education;DME and/or AE instruction;Therapeutic activities;Visual/perceptual remediation/compensation;Patient/family education;Balance training    OT Goals(Current goals can be found in the care plan section) Acute Rehab OT Goals Patient Stated Goal: "We will have to see"  Pt somewhat overwhelmed with diagnosis and deficits and demonstrated difficulty formulating goal  OT Goal Formulation: With patient Time For Goal Achievement: 07/14/18 Potential to Achieve Goals: Good ADL Goals Pt Will Perform Grooming: with set-up;sitting Pt Will Perform Upper Body Bathing: with min assist;sitting Pt Will Perform Lower Body Bathing: with mod assist;sit to/from stand Pt Will Transfer to Toilet: with mod assist;squat pivot transfer;bedside commode Pt/caregiver will Perform Home Exercise Program: Increased ROM;Increased strength;Right Upper extremity Additional ADL Goal #1: Pt will use Rt UE as a stabilizer during ADLs with mod A  OT Frequency: Min 2X/week   Barriers to D/C: Decreased caregiver support  unsure if family able to provide necessary level of assistance        Co-evaluation              AM-PAC PT "6 Clicks" Daily Activity     Outcome Measure Help from another person eating meals?: Total Help from another person  taking care of personal grooming?: A Lot Help from another person toileting, which includes using toliet, bedpan, or urinal?: Total Help from another person bathing (including washing, rinsing, drying)?: A Lot Help from another person to put on and taking off regular upper body clothing?: Total Help from another person to put on and taking off regular lower body clothing?: Total 6 Click Score: 8   End of Session Nurse Communication: Mobility status  Activity Tolerance: Patient limited by fatigue Patient left: in bed;with call bell/phone within reach  OT Visit Diagnosis: Hemiplegia and hemiparesis Hemiplegia - Right/Left: Right Hemiplegia - dominant/non-dominant: Dominant Hemiplegia - caused by: Cerebral infarction                Time: 1610-9604 OT Time Calculation (min): 18 min Charges:  OT General Charges $OT Visit: 1 Visit OT Evaluation $OT Eval Moderate Complexity: 1 Mod G-Codes:     [employer], OTR/L [phone]   Gina Boone M 06/30/2018, 5:27 PM

## 2018-06-30 NOTE — Consult Note (Signed)
Physical Medicine and Rehabilitation Consult   Reason for Consult: Stroke with right sided weakness and dysphagia.  Referring Physician: Dr. Pearlean Brownie.    HPI: Gina Boone is a 84 y.o. female with history of recent right frontal cortical SAH as well as TIA last week who was admitted on July 17, 2018 with right facial weakness, right sided weakness and elevated BP -210/110.  CT head showed normal brain perfusion with occlusion of L-VA, calcified plaque <50% bilateral proximal ICA.  MRI/MRA brain done revealing left paramedian pontine acute/early subacute infarct with occluded L-VA below PICA and chronic white matter changes. 2D echo done today and work up underway.  NPO recommended due to signs of dysphagia and moderate dysarthria noted on ST evaluation. CIR recommended for follow up therapy.      Review of Systems  Constitutional: Negative for chills and fever.  HENT: Negative for hearing loss and tinnitus.   Respiratory: Negative for cough and shortness of breath.   Cardiovascular: Negative for chest pain and palpitations.  Gastrointestinal: Negative for heartburn and nausea.  Musculoskeletal: Negative for back pain and myalgias.  Neurological: Positive for dizziness (chronic but worse in the past couple of weeks), speech change and focal weakness.     Past Medical History:  Diagnosis Date  . Hypertension   . Kidney stones   . Stroke [facility])     Past Surgical History:  Procedure Laterality Date  . APPENDECTOMY  2012  . renal stents      Family History  Problem Relation Age of Onset  . Heart disease Mother   . High blood pressure Brother      Social History:  Lived alone and independent prior to 06/14/18.  Has been staying with daughter. Retired Scientist, forensic.  reports that she has never smoked. She has never used smokeless tobacco. She reports that she does not drink alcohol. She reports that she does not use drugs.    Allergies  Allergen Reactions  . Amlodipine Other (See  Comments)    fatigue  . Hydrochlorothiazide Other (See Comments)    Hair loss  . Ibuprofen     Affects her kidneys  . Penicillins Other (See Comments)    Has patient had a PCN reaction causing immediate rash, facial/tongue/throat swelling, SOB or lightheadedness with hypotension: Unknown Has patient had a PCN reaction causing severe rash involving mucus membranes or skin necrosis: Unknown Has patient had a PCN reaction that required hospitalization: Unknown Has patient had a PCN reaction occurring within the last 10 years: Unknown If all of the above answers are "NO", then may proceed with Cephalosporin use.     Medications Prior to Admission  Medication Sig Dispense Refill  . Biotin 2.5 MG TABS Take 2.5 mg by mouth daily.    . carvedilol (COREG) 6.25 MG tablet Take 6.25 mg by mouth 2 (two) times daily with a meal.    . cholecalciferol (VITAMIN D) 1000 units tablet Take 5,000 Units by mouth daily.    . cyanocobalamin 100 MCG tablet Take 100 mcg by mouth daily.    . furosemide (LASIX) 40 MG tablet Take 60 mg by mouth daily.  5  . niacin 500 MG tablet Take 500 mg by mouth at bedtime.    Marland Kitchen NIFEdipine (PROCARDIA-XL/ADALAT CC) 60 MG 24 hr tablet Take 60 mg by mouth daily.  5    Home: Home Living Family/patient expects to be discharged to:: Skilled nursing facility Living Arrangements: Children  Functional History:   Functional Status:  Mobility:          ADL:    Cognition: Cognition Overall Cognitive Status: Within Functional Limits for tasks assessed Orientation Level: Oriented to person, Oriented to place, Oriented to situation, Disoriented to time Cognition Overall Cognitive Status: Within Functional Limits for tasks assessed   Blood pressure (!) 203/72, pulse 81, temperature 98.1 F (36.7 C), temperature source Oral, resp. rate 20, height 5\' 4"  (1.626 m), weight 48.5 kg (106 lb 14.8 oz), SpO2 98 %. Physical Exam  Nursing note and vitals reviewed. Constitutional:  She appears well-developed.  Fatigued appearing.   HENT:  Head: Normocephalic and atraumatic.  Eyes: Pupils are equal, round, and reactive to light.  Neck: Normal range of motion.  Cardiovascular: Normal rate.  Respiratory: Effort normal.  GI: Soft.  Neurological: She is alert.  Soft wet voice. Right central 7 with tongue deviation. Speech very dysarthric. Fair insight and awareness. Able to answer simple biographical questions.  RUE 0/5. RLE 1 to 1+/5 in extensor pattern. DTR's 3+ RLE, toes up. Senses pain in all 4's.   Psychiatric:  Pleasant and cooperative    Results for orders placed or performed during the hospital encounter of 07/04/2018 (from the past 24 hour(s))  I-stat troponin, ED     Status: None   Collection Time: 07/15/2018  5:27 PM  Result Value Ref Range   Troponin i, poc 0.01 0.00 - 0.08 ng/mL   Comment 3          I-Stat Chem 8, ED     Status: Abnormal   Collection Time: 07/05/2018  5:29 PM  Result Value Ref Range   Sodium 133 (L) 135 - 145 mmol/L   Potassium 3.6 3.5 - 5.1 mmol/L   Chloride 93 (L) 98 - 111 mmol/L   BUN 31 (H) 8 - 23 mg/dL   Creatinine, Ser 1.61 (H) 0.44 - 1.00 mg/dL   Glucose, Bld 096 (H) 70 - 99 mg/dL   Calcium, Ion 0.45 (L) 1.15 - 1.40 mmol/L   TCO2 35 (H) 22 - 32 mmol/L   Hemoglobin 16.0 (H) 12.0 - 15.0 g/dL   HCT 40.9 (H) 81.1 - 91.4 %  Protime-INR     Status: None   Collection Time: 07/22/2018  6:14 PM  Result Value Ref Range   Prothrombin Time 13.3 11.4 - 15.2 seconds   INR 1.02   APTT     Status: None   Collection Time: 07/17/2018  6:14 PM  Result Value Ref Range   aPTT 29 24 - 36 seconds  CBC     Status: Abnormal   Collection Time: 07/11/2018  6:14 PM  Result Value Ref Range   WBC 4.8 4.0 - 10.5 K/uL   RBC 5.12 (H) 3.87 - 5.11 MIL/uL   Hemoglobin 14.6 12.0 - 15.0 g/dL   HCT 78.2 95.6 - 21.3 %   MCV 87.3 78.0 - 100.0 fL   MCH 28.5 26.0 - 34.0 pg   MCHC 32.7 30.0 - 36.0 g/dL   RDW 08.6 57.8 - 46.9 %   Platelets 272 150 - 400 K/uL    Differential     Status: Abnormal   Collection Time: 07/19/2018  6:14 PM  Result Value Ref Range   Neutrophils Relative % 77 %   Neutro Abs 3.7 1.7 - 7.7 K/uL   Lymphocytes Relative 11 %   Lymphs Abs 0.5 (L) 0.7 - 4.0 K/uL   Monocytes Relative 10 %   Monocytes Absolute 0.5 0.1 - 1.0 K/uL  Eosinophils Relative 2 %   Eosinophils Absolute 0.1 0.0 - 0.7 K/uL   Basophils Relative 0 %   Basophils Absolute 0.0 0.0 - 0.1 K/uL   Immature Granulocytes 0 %   Abs Immature Granulocytes 0.0 0.0 - 0.1 K/uL  Comprehensive metabolic panel     Status: Abnormal   Collection Time: 05-Jul-2018  6:14 PM  Result Value Ref Range   Sodium 132 (L) 135 - 145 mmol/L   Potassium 3.6 3.5 - 5.1 mmol/L   Chloride 89 (L) 98 - 111 mmol/L   CO2 30 22 - 32 mmol/L   Glucose, Bld 123 (H) 70 - 99 mg/dL   BUN 24 (H) 8 - 23 mg/dL   Creatinine, Ser 1.61 (H) 0.44 - 1.00 mg/dL   Calcium 9.0 8.9 - 09.6 mg/dL   Total Protein 6.1 (L) 6.5 - 8.1 g/dL   Albumin 2.9 (L) 3.5 - 5.0 g/dL   AST 38 15 - 41 U/L   ALT 22 0 - 44 U/L   Alkaline Phosphatase 88 38 - 126 U/L   Total Bilirubin 1.4 (H) 0.3 - 1.2 mg/dL   GFR calc non Af Amer 32 (L) >60 mL/min   GFR calc Af Amer 37 (L) >60 mL/min   Anion gap 13 5 - 15  Ethanol     Status: None   Collection Time: July 05, 2018  6:14 PM  Result Value Ref Range   Alcohol, Ethyl (B) <10 <10 mg/dL  Urine rapid drug screen (hosp performed)     Status: Abnormal   Collection Time: 07-05-2018  7:30 PM  Result Value Ref Range   Opiates NONE DETECTED NONE DETECTED   Cocaine NONE DETECTED NONE DETECTED   Benzodiazepines NONE DETECTED NONE DETECTED   Amphetamines NONE DETECTED NONE DETECTED   Tetrahydrocannabinol NONE DETECTED NONE DETECTED   Barbiturates (A) NONE DETECTED    Result not available. Reagent lot number recalled by manufacturer.  Urinalysis, Routine w reflex microscopic     Status: Abnormal   Collection Time: 05-Jul-2018  7:30 PM  Result Value Ref Range   Color, Urine STRAW (A) YELLOW    APPearance CLEAR CLEAR   Specific Gravity, Urine 1.014 1.005 - 1.030   pH 8.0 5.0 - 8.0   Glucose, UA NEGATIVE NEGATIVE mg/dL   Hgb urine dipstick NEGATIVE NEGATIVE   Bilirubin Urine NEGATIVE NEGATIVE   Ketones, ur NEGATIVE NEGATIVE mg/dL   Protein, ur NEGATIVE NEGATIVE mg/dL   Nitrite NEGATIVE NEGATIVE   Leukocytes, UA NEGATIVE NEGATIVE  Hemoglobin A1c     Status: Abnormal   Collection Time: 07-05-2018 10:53 PM  Result Value Ref Range   Hgb A1c MFr Bld 5.7 (H) 4.8 - 5.6 %   Mean Plasma Glucose 116.89 mg/dL  Lipid panel     Status: Abnormal   Collection Time: 07/05/18 10:53 PM  Result Value Ref Range   Cholesterol 248 (H) 0 - 200 mg/dL   Triglycerides 89 <045 mg/dL   HDL 59 >40 mg/dL   Total CHOL/HDL Ratio 4.2 RATIO   VLDL 18 0 - 40 mg/dL   LDL Cholesterol 981 (H) 0 - 99 mg/dL  Basic metabolic panel     Status: Abnormal   Collection Time: 06/30/18  3:38 AM  Result Value Ref Range   Sodium 137 135 - 145 mmol/L   Potassium 3.2 (L) 3.5 - 5.1 mmol/L   Chloride 91 (L) 98 - 111 mmol/L   CO2 31 22 - 32 mmol/L   Glucose, Bld 107 (H) 70 -  99 mg/dL   BUN 22 8 - 23 mg/dL   Creatinine, Ser 1.611.27 (H) 0.44 - 1.00 mg/dL   Calcium 9.6 8.9 - 09.610.3 mg/dL   GFR calc non Af Amer 37 (L) >60 mL/min   GFR calc Af Amer 43 (L) >60 mL/min   Anion gap 15 5 - 15  Sedimentation rate     Status: Abnormal   Collection Time: 06/30/18  3:38 AM  Result Value Ref Range   Sed Rate 24 (H) 0 - 22 mm/hr   Ct Angio Head W Or Wo Contrast  Result Date: 06/28/2018 CLINICAL DATA:  84 y/o  F; slurred speech and right-sided deficits. EXAM: CT ANGIOGRAPHY HEAD AND NECK CT PERFUSION BRAIN TECHNIQUE: Multidetector CT imaging of the head and neck was performed using the standard protocol during bolus administration of intravenous contrast. Multiplanar CT image reconstructions and MIPs were obtained to evaluate the vascular anatomy. Carotid stenosis measurements (when applicable) are obtained utilizing NASCET criteria, using  the distal internal carotid diameter as the denominator. Multiphase CT imaging of the brain was performed following IV bolus contrast injection. Subsequent parametric perfusion maps were calculated using RAPID software. CONTRAST:  100mL ISOVUE-370 IOPAMIDOL (ISOVUE-370) INJECTION 76% COMPARISON:  07/12/2018 CT head. FINDINGS: CTA NECK FINDINGS Aortic arch: Standard branching. Imaged portion shows no evidence of aneurysm or dissection. Left subclavian artery origin and downstream tandem segments of 50% moderate stenosis with predominant fibrofatty plaque. Moderate mixed plaque of the aortic arch. Right carotid system: No evidence of dissection, stenosis (50% or greater) or occlusion. Calcified plaque of the carotid bifurcation with mild less than 50% proximal ICA stenosis. Left carotid system: No evidence of dissection, stenosis (50% or greater) or occlusion. Calcified plaque of the carotid bifurcation with mild less 50% proximal ICA stenosis. Vertebral arteries: Widely patent right dominant vertebral artery. Proximal left vertebral artery is patent with several segments of stenosis. The downstream vessel as a gradient decrease in attenuation with occlusion at the V3 segment. The left vertebral artery between basilar PICA is patent although diminutive. Skeleton: Moderate cervical spondylosis with multilevel disc and facet degenerative changes. Grade 1 C4-5 anterolisthesis. No high-grade bony canal stenosis. Other neck: Subcentimeter thyroid nodules. Upper chest: Mild biapical pleuroparenchymal scarring. Review of the MIP images confirms the above findings CTA HEAD FINDINGS Anterior circulation: Calcified plaque of the carotid siphons with moderate 50% bilateral paraclinoid stenosis. No large vessel occlusion, aneurysm, or additional segment of high-grade stenosis. Posterior circulation: Patent right vertebral artery. Occluded left V3 and V4 to the PICA origin. The left vertebral artery between the vertebrobasilar  junction and PICA origin is patent and diminutive. Basilar artery is patent with lumen irregularity of multiple segments of mild stenosis. Patent bilateral PCA. Right distal P2 segment of moderate stenosis and bilateral proximal P2 segments of mild stenosis. Venous sinuses: As permitted by contrast timing, patent. Anatomic variants: Fetal right PCA. Probable small anterior communicating artery. No left posterior communicating artery identified, likely hypoplastic or absent. Review of the MIP images confirms the above findings CT Brain Perfusion Findings: CBF (<30%) Volume: 0mL Perfusion (Tmax>6.0s) volume: 0mL Mismatch Volume: 0mL Infarction Location:Negative. IMPRESSION: 1. Normal CT brain perfusion. 2. 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 Patent diminutive left vertebral artery between vertebrobasilar junction and left PICA origin. 3. Patent carotid systems and right vertebral artery without dissection or high-grade stenosis. 4. Patent circle-of-Willis, bilateral ACA, bilateral MCA, and bilateral PCA. No large vessel occlusion or aneurysm. 5. Calcified plaque of carotid  bifurcations with bilateral proximal ICA mild less 50% stenosis. 6. Calcified plaque of carotid siphons with moderate bilateral paraclinoid stenosis. These results were called by telephone at the time of interpretation on 07-05-2018 at 6:21 pm to Dr. Ritta Slot , who verbally acknowledged these results. Electronically Signed   By: Mitzi Hansen M.D.   On: Jul 05, 2018 18:21   Ct Angio Neck W Or Wo Contrast  Result Date: 05-Jul-2018 CLINICAL DATA:  84 y/o  F; slurred speech and right-sided deficits. EXAM: CT ANGIOGRAPHY HEAD AND NECK CT PERFUSION BRAIN TECHNIQUE: Multidetector CT imaging of the head and neck was performed using the standard protocol during bolus administration of intravenous contrast. Multiplanar CT image reconstructions and MIPs were obtained to evaluate the  vascular anatomy. Carotid stenosis measurements (when applicable) are obtained utilizing NASCET criteria, using the distal internal carotid diameter as the denominator. Multiphase CT imaging of the brain was performed following IV bolus contrast injection. Subsequent parametric perfusion maps were calculated using RAPID software. CONTRAST:  ISOVUE-370 IOPAMIDOL (ISOVUE-370) INJECTION 76% COMPARISON:  07/05/18 CT head. FINDINGS: CTA NECK FINDINGS Aortic arch: Standard branching. Imaged portion shows no evidence of aneurysm or dissection. Left subclavian artery origin and downstream tandem segments of 50% moderate stenosis with predominant fibrofatty plaque. Moderate mixed plaque of the aortic arch. Right carotid system: No evidence of dissection, stenosis (50% or greater) or occlusion. Calcified plaque of the carotid bifurcation with mild less than 50% proximal ICA stenosis. Left carotid system: No evidence of dissection, stenosis (50% or greater) or occlusion. Calcified plaque of the carotid bifurcation with mild less 50% proximal ICA stenosis. Vertebral arteries: Widely patent right dominant vertebral artery. Proximal left vertebral artery is patent with several segments of stenosis. The downstream vessel as a gradient decrease in attenuation with occlusion at the V3 segment. The left vertebral artery between basilar PICA is patent although diminutive. Skeleton: Moderate cervical spondylosis with multilevel disc and facet degenerative changes. Grade 1 C4-5 anterolisthesis. No high-grade bony canal stenosis. Other neck: Subcentimeter thyroid nodules. Upper chest: Mild biapical pleuroparenchymal scarring. Review of the MIP images confirms the above findings CTA HEAD FINDINGS Anterior circulation: Calcified plaque of the carotid siphons with moderate 50% bilateral paraclinoid stenosis. No large vessel occlusion, aneurysm, or additional segment of high-grade stenosis. Posterior circulation: Patent right  vertebral artery. Occluded left V3 and V4 to the PICA origin. The left vertebral artery between the vertebrobasilar junction and PICA origin is patent and diminutive. Basilar artery is patent with lumen irregularity of multiple segments of mild stenosis. Patent bilateral PCA. Right distal P2 segment of moderate stenosis and bilateral proximal P2 segments of mild stenosis. Venous sinuses: As permitted by contrast timing, patent. Anatomic variants: Fetal right PCA. Probable small anterior communicating artery. No left posterior communicating artery identified, likely hypoplastic or absent. Review of the MIP images confirms the above findings CT Brain Perfusion Findings: CBF (<30%) Volume: 0mL Perfusion (Tmax>6.0s) volume: 0mL Mismatch Volume: 0mL Infarction Location:Negative. IMPRESSION: 1. Normal CT brain perfusion. 2. 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 Patent diminutive left vertebral artery between vertebrobasilar junction and left PICA origin. 3. Patent carotid systems and right vertebral artery without dissection or high-grade stenosis. 4. Patent circle-of-Willis, bilateral ACA, bilateral MCA, and bilateral PCA. No large vessel occlusion or aneurysm. 5. Calcified plaque of carotid bifurcations with bilateral proximal ICA mild less 50% stenosis. 6. Calcified plaque of carotid siphons with moderate bilateral paraclinoid stenosis. These results were called by telephone at the time  of interpretation on 07/10/2018 at 6:21 pm to Dr. Ritta Slot , who verbally acknowledged these results. Electronically Signed   By: Mitzi Hansen M.D.   On: 07/04/2018 18:21   Mr Brain Wo Contrast  Result Date: 07/18/2018 CLINICAL DATA:  84 y/o F; slurred speech, right facial droop, right-sided weakness. EXAM: MRI HEAD WITHOUT CONTRAST MRA HEAD WITHOUT CONTRAST TECHNIQUE: Multiplanar, multiecho pulse sequences of the brain and surrounding structures were obtained  without intravenous contrast. Angiographic images of the head were obtained using MRA technique without contrast. COMPARISON:  06/23/2018 CT head, CT perfusion head, CT angiogram head. FINDINGS: MRI HEAD FINDINGS Brain: Linear reduced diffusion within the left paramedian pons compatible with acute/early subacute infarction and corresponding to hypoattenuation on CT (series 6 image 10-14). No associated hemorrhage or mass effect. Stable mild chronic microvascular ischemic changes and parenchymal volume loss of the brain for age. No abnormal susceptibility hypointensity to indicate intracranial hemorrhage. No extra-axial collection, hydrocephalus, focal mass effect, or herniation. Vascular: As below. Skull and upper cervical spine: Normal marrow signal. Sinuses/Orbits: Negative. Other: None. MRA HEAD FINDINGS Internal carotid arteries: Irregularity of bilateral carotid siphons with low signal calcified plaque with moderate bilateral paraclinoid stenosis. Anterior cerebral arteries:  Patent. Middle cerebral arteries: Patent. Anterior communicating artery: Patent. Posterior communicating arteries: Fetal right PCA. No left posterior communicating artery identified, likely hypoplastic or absent. Posterior cerebral arteries:  Patent. Basilar artery: Diffuse irregularity with multiple segments of mild stenosis. Vertebral arteries: Patent right vertebral artery. Diminutive left vertebral artery between the vertebrobasilar junction and left PICA origin. No appreciable vertebral artery below the level of the PICA, better characterized on prior CT angiogram. IMPRESSION: 1. Left paramedian pontine acute/early subacute infarction. No associated hemorrhage or mass effect. 2. 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 No additional intracranial large vessel occlusion. 3. Stable chronic microvascular ischemic changes and parenchymal volume loss of  the brain. These results were called by telephone at the time of interpretation on 06/30/2018 at 9:08 pm to Dr. Alona Bene , who verbally acknowledged these results. Electronically Signed   By: Mitzi Hansen M.D.   On: 07/19/2018 21:11   Ct Cerebral Perfusion W Contrast  Result Date: 06/23/2018 CLINICAL DATA:  84 y/o  F; slurred speech and right-sided deficits. EXAM: CT ANGIOGRAPHY HEAD AND NECK CT PERFUSION BRAIN TECHNIQUE: Multidetector CT imaging of the head and neck was performed using the standard protocol during bolus administration of intravenous contrast. Multiplanar CT image reconstructions and MIPs were obtained to evaluate the vascular anatomy. Carotid stenosis measurements (when applicable) are obtained utilizing NASCET criteria, using the distal internal carotid diameter as the denominator. Multiphase CT imaging of the brain was performed following IV bolus contrast injection. Subsequent parametric perfusion maps were calculated using RAPID software. CONTRAST:  ISOVUE-370 IOPAMIDOL (ISOVUE-370) INJECTION 76% COMPARISON:  07/13/2018 CT head. FINDINGS: CTA NECK FINDINGS Aortic arch: Standard branching. Imaged portion shows no evidence of aneurysm or dissection. Left subclavian artery origin and downstream tandem segments of 50% moderate stenosis with predominant fibrofatty plaque. Moderate mixed plaque of the aortic arch. Right carotid system: No evidence of dissection, stenosis (50% or greater) or occlusion. Calcified plaque of the carotid bifurcation with mild less than 50% proximal ICA stenosis. Left carotid system: No evidence of dissection, stenosis (50% or greater) or occlusion. Calcified plaque of the carotid bifurcation with mild less 50% proximal ICA stenosis. Vertebral arteries: Widely patent right dominant vertebral artery. Proximal  left vertebral artery is patent with several segments of stenosis. The downstream vessel as a gradient decrease in attenuation with occlusion at  the V3 segment. The left vertebral artery between basilar PICA is patent although diminutive. Skeleton: Moderate cervical spondylosis with multilevel disc and facet degenerative changes. Grade 1 C4-5 anterolisthesis. No high-grade bony canal stenosis. Other neck: Subcentimeter thyroid nodules. Upper chest: Mild biapical pleuroparenchymal scarring. Review of the MIP images confirms the above findings CTA HEAD FINDINGS Anterior circulation: Calcified plaque of the carotid siphons with moderate 50% bilateral paraclinoid stenosis. No large vessel occlusion, aneurysm, or additional segment of high-grade stenosis. Posterior circulation: Patent right vertebral artery. Occluded left V3 and V4 to the PICA origin. The left vertebral artery between the vertebrobasilar junction and PICA origin is patent and diminutive. Basilar artery is patent with lumen irregularity of multiple segments of mild stenosis. Patent bilateral PCA. Right distal P2 segment of moderate stenosis and bilateral proximal P2 segments of mild stenosis. Venous sinuses: As permitted by contrast timing, patent. Anatomic variants: Fetal right PCA. Probable small anterior communicating artery. No left posterior communicating artery identified, likely hypoplastic or absent. Review of the MIP images confirms the above findings CT Brain Perfusion Findings: CBF (<30%) Volume: 0mL Perfusion (Tmax>6.0s) volume: 0mL Mismatch Volume: 0mL Infarction Location:Negative. IMPRESSION: 1. Normal CT brain perfusion. 2. 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 Patent diminutive left vertebral artery between vertebrobasilar junction and left PICA origin. 3. Patent carotid systems and right vertebral artery without dissection or high-grade stenosis. 4. Patent circle-of-Willis, bilateral ACA, bilateral MCA, and bilateral PCA. No large vessel occlusion or aneurysm. 5. Calcified plaque of carotid bifurcations with bilateral proximal ICA  mild less 50% stenosis. 6. Calcified plaque of carotid siphons with moderate bilateral paraclinoid stenosis. These results were called by telephone at the time of interpretation on 07/03/2018 at 6:21 pm to Dr. Ritta Slot , who verbally acknowledged these results. Electronically Signed   By: Mitzi Hansen M.D.   On: 06/28/2018 18:21   Mr Maxine Glenn Head Wo Contrast  Result Date: 07/11/2018 CLINICAL DATA:  84 y/o F; slurred speech, right facial droop, right-sided weakness. EXAM: MRI HEAD WITHOUT CONTRAST MRA HEAD WITHOUT CONTRAST TECHNIQUE: Multiplanar, multiecho pulse sequences of the brain and surrounding structures were obtained without intravenous contrast. Angiographic images of the head were obtained using MRA technique without contrast. COMPARISON:  06/24/2018 CT head, CT perfusion head, CT angiogram head. FINDINGS: MRI HEAD FINDINGS Brain: Linear reduced diffusion within the left paramedian pons compatible with acute/early subacute infarction and corresponding to hypoattenuation on CT (series 6 image 10-14). No associated hemorrhage or mass effect. Stable mild chronic microvascular ischemic changes and parenchymal volume loss of the brain for age. No abnormal susceptibility hypointensity to indicate intracranial hemorrhage. No extra-axial collection, hydrocephalus, focal mass effect, or herniation. Vascular: As below. Skull and upper cervical spine: Normal marrow signal. Sinuses/Orbits: Negative. Other: None. MRA HEAD FINDINGS Internal carotid arteries: Irregularity of bilateral carotid siphons with low signal calcified plaque with moderate bilateral paraclinoid stenosis. Anterior cerebral arteries:  Patent. Middle cerebral arteries: Patent. Anterior communicating artery: Patent. Posterior communicating arteries: Fetal right PCA. No left posterior communicating artery identified, likely hypoplastic or absent. Posterior cerebral arteries:  Patent. Basilar artery: Diffuse irregularity with multiple  segments of mild stenosis. Vertebral arteries: Patent right vertebral artery. Diminutive left vertebral artery between the vertebrobasilar junction and left PICA origin. No appreciable vertebral artery below the level of the PICA, better characterized on prior CT angiogram.  IMPRESSION: 1. Left paramedian pontine acute/early subacute infarction. No associated hemorrhage or mass effect. 2. 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 No additional intracranial large vessel occlusion. 3. Stable chronic microvascular ischemic changes and parenchymal volume loss of the brain. These results were called by telephone at the time of interpretation on 2018/07/05 at 9:08 pm to Dr. Alona Bene , who verbally acknowledged these results. Electronically Signed   By: Mitzi Hansen M.D.   On: 05-Jul-2018 21:11   Ct Head Code Stroke Wo Contrast  Result Date: July 05, 2018 CLINICAL DATA:  Code stroke. Right-sided facial droop and slurred speech. EXAM: CT HEAD WITHOUT CONTRAST TECHNIQUE: Contiguous axial images were obtained from the base of the skull through the vertex without intravenous contrast. COMPARISON:  None. FINDINGS: Brain: Linear lucency in left hemi pons may represent a pontine perforator infarction, age indeterminate. No additional acute infarction identified. No hemorrhage, extra-axial collection, hydrocephalus, or herniation. Mild for age chronic microvascular ischemic changes and parenchymal volume loss of the brain. Small chronic lacunar infarct in the right putamen. Vascular: Calcific atherosclerosis of the carotid siphons. No hyperdense vessel identified. Skull: Normal. Negative for fracture or focal lesion. Sinuses/Orbits: No acute finding. Other: None. ASPECTS [facility] Stroke Program Early CT Score) - Ganglionic level infarction (caudate, lentiform nuclei, internal capsule, insula, M1-M3 cortex): 7 - Supraganglionic  infarction (M4-M6 cortex): 3 Total score (0-10 with 10 being normal): 10 IMPRESSION: 1. Linear lucency in left paramedian pons compatible with pontine perforator infarct, age indeterminate. 2. Mild chronic microvascular ischemic changes and parenchymal volume loss of the brain for age. 3. ASPECTS is 10 These results were called by telephone at the time of interpretation on 2018/07/05 at 5:37 pm to Dr. Amada Jupiter, who verbally acknowledged these results. Electronically Signed   By: Mitzi Hansen M.D.   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 Does the need for close, 24 hr/day medical supervision in concert with the patient's rehab needs make it unreasonable for this patient to be served in a less intensive setting? Yes 2. Co-Morbidities requiring supervision/potential complications: HTN, hx of SAH, TIA 3. Due to bladder management, bowel management, safety, skin/wound care, disease management, medication administration, pain management and patient education, does the patient require 24 hr/day rehab nursing? Yes 4. Does the patient require coordinated care of a physician, rehab nurse, PT (1-2 hrs/day, 5 days/week), OT (1-2 hrs/day, 5 days/week) and SLP (1-2 hrs/day, 5 days/week) to address physical and functional deficits in the context of the above medical diagnosis(es)? Yes Addressing deficits in the following areas: balance, endurance, locomotion, strength, transferring, bowel/bladder control, bathing, dressing, feeding, grooming, toileting, cognition, speech, swallowing and psychosocial support 5. Can the patient actively participate in an intensive therapy program of at least 3 hrs of therapy per day at least 5 days per week? Yes 6. The potential for patient to make measurable gains while on inpatient rehab is excellent 7. Anticipated functional outcomes upon discharge from inpatient rehab are  supervision and min assist  with PT, supervision, min assist and mod assist with OT, supervision with SLP. 8. Estimated rehab length of stay to reach the above functional goals is: 20-24 days 9. Anticipated D/C setting: Home 10. Anticipated post D/C treatments: HH therapy and Outpatient therapy 11. Overall Rehab/Functional Prognosis: excellent  RECOMMENDATIONS: This patient's condition is appropriate for continued rehabilitative care in the following setting: CIR Patient  has agreed to participate in recommended program. Yes Note that insurance prior authorization may be required for reimbursement for recommended care.  Comment: Rehab Admissions Coordinator to follow up.  Thanks,  Ranelle Oyster, MD, [place]  I have personally performed a face to face diagnostic evaluation of this patient. Additionally, I have reviewed and concur with the physician assistant's documentation above.     Jacquelynn Cree, PA-C 06/30/2018

## 2018-06-30 NOTE — Progress Notes (Signed)
Pt noted to have a congested cough and rhonchi all over when lung ausculted. MD on unit notified. New order received. Will continue to closely monitor pt. Dionne BucyP. Amo Clarita Mcelvain RN

## 2018-06-30 NOTE — Evaluation (Signed)
Physical Therapy Evaluation Patient Details Name: Gina Boone MRN: [id number] DOB: 16-Mar-1929 Today's Date: 06/30/2018   History of Present Illness  84 y.o. female with a history of recent subarachnoid hemorrhage who presents with Right-sided weakness.  Found to have infarct in left paramedian pons  Clinical Impression  Orders received for PT evaluation. Patient demonstrates deficits in functional mobility as indicated below. Will benefit from continued skilled PT to address deficits and maximize function. Will see as indicated and progress as tolerated.  Prior to admission patient reports full independence. Currently, patient requiring max assist for mobility and shows significant right sided deficits. Feel patient will need post acute rehabilitation upon acute discharge. Will recommend CIR consult at this time.    Follow Up Recommendations CIR    Equipment Recommendations  (TBD)    Recommendations for Other Services Rehab consult     Precautions / Restrictions Precautions Precautions: Fall      Mobility  Bed Mobility Overal bed mobility: Needs Assistance Bed Mobility: Supine to Sit;Sit to Supine;Rolling Rolling: Mod assist   Supine to sit: Mod assist Sit to supine: Min assist   General bed mobility comments: Increased time and effort to perform, moderate assist to power trunk to upright position with patient increased effort of LUE to push off. Assist to elevate LE back to bed  Transfers Overall transfer level: Needs assistance Equipment used: 2 person hand held assist Transfers: Sit to/from Stand Sit to Stand: Max assist;+2 physical assistance         General transfer comment: +2 max assist to power up with RLE knee blocking to maintain positioning. Heavy posterior list and poor ability to control to midline  Ambulation/Gait             General Gait Details: unable to perform  Stairs            Wheelchair Mobility    Modified Rankin (Stroke Patients  Only) Modified Rankin (Stroke Patients Only) Pre-Morbid Rankin Score: No symptoms Modified Rankin: Severe disability     Balance Overall balance assessment: Needs assistance Sitting-balance support: Feet supported;Single extremity supported Sitting balance-Leahy Scale: Poor Sitting balance - Comments: heavy lateral list with limited time to come to midline (left lateral lean with reliance on single extremity support) Postural control: Left lateral lean   Standing balance-Leahy Scale: Zero                               Pertinent Vitals/Pain Pain Assessment: Faces Faces Pain Scale: Hurts little more Pain Descriptors / Indicators: Discomfort;Guarding Pain Intervention(s): Monitored during session;Repositioned    Home Living Family/patient expects to be discharged to:: Private residence   Available Help at Discharge: Family Type of Home: House Home Access: Ramped entrance     Home Layout: One level        Prior Function Level of Independence: Independent               Hand Dominance   Dominant Hand: Right    Extremity/Trunk Assessment   Upper Extremity Assessment Upper Extremity Assessment: RUE deficits/detail;Defer to OT evaluation    Lower Extremity Assessment Lower Extremity Assessment: RLE deficits/detail RLE Deficits / Details: significant assymetrical weakness, RLE 1+/5 gross motions with testing RLE Sensation: decreased light touch RLE Coordination: decreased fine motor;decreased gross motor    Cervical / Trunk Assessment Cervical / Trunk Assessment: Kyphotic  Communication   Communication: Expressive difficulties  Cognition Arousal/Alertness: Awake/alert Behavior [facility]  for tasks assessed/performed Overall Cognitive Status: Within Functional Limits for tasks assessed                                        General Comments      Exercises     Assessment/Plan    PT Assessment Patient needs  continued PT services  PT Problem List Decreased strength;Decreased activity tolerance;Decreased balance;Decreased mobility;Decreased coordination       PT Treatment Interventions DME instruction;Gait training;Functional mobility training;Therapeutic activities;Therapeutic exercise;Balance training;Neuromuscular re-education;Patient/family education    PT Goals (Current goals can be found in the Care Plan section)  Acute Rehab PT Goals Patient Stated Goal: to get back to independence PT Goal Formulation: With patient/family Time For Goal Achievement: 07/14/18 Potential to Achieve Goals: Fair    Frequency Min 3X/week   Barriers to discharge        Co-evaluation               AM-PAC PT "6 Clicks" Daily Activity  Outcome Measure Difficulty turning over in bed (including adjusting bedclothes, sheets and blankets)?: Unable Difficulty moving from lying on back to sitting on the side of the bed? : Unable Difficulty sitting down on and standing up from a chair with arms (e.g., wheelchair, bedside commode, etc,.)?: Unable Help needed moving to and from a bed to chair (including a wheelchair)?: A Lot Help needed walking in hospital room?: Total Help needed climbing 3-5 steps with a railing? : Total 6 Click Score: 7    End of Session Equipment Utilized During Treatment: Gait belt Activity Tolerance: Patient tolerated treatment well;Patient limited by fatigue Patient left: in bed;with call bell/phone within reach;with bed alarm set;with family/visitor present;with SCD's reapplied Nurse Communication: Mobility status PT Visit Diagnosis: Muscle weakness (generalized) (M62.81);Other symptoms and signs involving the nervous system (R29.898)    Time: 1610-9604 PT Time Calculation (min) (ACUTE ONLY): 21 min   Charges:   PT Evaluation $PT Eval Moderate Complexity: 1 Mod     PT G Codes:        Charlotte Crumb, PT DPT  Board Certified Neurologic Specialist [phone]   Fabio Asa 06/30/2018, 4:48 PM

## 2018-07-01 ENCOUNTER — Inpatient Hospital Stay (HOSPITAL_COMMUNITY): Payer: Medicare Other

## 2018-07-01 DIAGNOSIS — M7989 Other specified soft tissue disorders: Secondary | ICD-10-CM

## 2018-07-01 DIAGNOSIS — E440 Moderate protein-calorie malnutrition: Secondary | ICD-10-CM

## 2018-07-01 DIAGNOSIS — J690 Pneumonitis due to inhalation of food and vomit: Secondary | ICD-10-CM

## 2018-07-01 LAB — BASIC METABOLIC PANEL
ANION GAP: 12 (ref 5–15)
BUN: 18 mg/dL (ref 8–23)
CALCIUM: 9.1 mg/dL (ref 8.9–10.3)
CHLORIDE: 98 mmol/L (ref 98–111)
CO2: 27 mmol/L (ref 22–32)
Creatinine, Ser: 1.18 mg/dL — ABNORMAL HIGH (ref 0.44–1.00)
GFR calc non Af Amer: 40 mL/min — ABNORMAL LOW (ref >60)
GFR, EST AFRICAN AMERICAN: 47 mL/min — AB (ref >60)
Glucose, Bld: 150 mg/dL — ABNORMAL HIGH (ref 70–99)
Potassium: 3.3 mmol/L — ABNORMAL LOW (ref 3.5–5.1)
SODIUM: 137 mmol/L (ref 135–145)

## 2018-07-01 LAB — BLOOD GAS, ARTERIAL
ACID-BASE EXCESS: 1.8 mmol/L (ref 0.0–2.0)
BICARBONATE: 24.4 mmol/L (ref 20.0–28.0)
DRAWN BY: 236041
FIO2: 100
O2 SAT: 96.7 %
PATIENT TEMPERATURE: 99.2
pCO2 arterial: 29.1 mmHg — ABNORMAL LOW (ref 32.0–48.0)
pH, Arterial: 7.535 — ABNORMAL HIGH (ref 7.350–7.450)
pO2, Arterial: 83.9 mmHg (ref 83.0–108.0)

## 2018-07-01 LAB — GLUCOSE, CAPILLARY: GLUCOSE-CAPILLARY: 163 mg/dL — AB (ref 70–99)

## 2018-07-01 LAB — MAGNESIUM
MAGNESIUM: 2.3 mg/dL (ref 1.7–2.4)
MAGNESIUM: 1.9 mg/dL (ref 1.7–2.4)

## 2018-07-01 LAB — PHOSPHORUS: Phosphorus: 2.5 mg/dL (ref 2.5–4.6)

## 2018-07-01 MED ORDER — NIFEDIPINE ER OSMOTIC RELEASE 30 MG PO TB24
30.0000 mg | ORAL_TABLET | Freq: Every day | ORAL | Status: DC
  Administered 2018-07-01: 30 mg via ORAL
  Filled 2018-07-01: qty 1

## 2018-07-01 MED ORDER — NIFEDIPINE ER OSMOTIC RELEASE 30 MG PO TB24
30.0000 mg | ORAL_TABLET | Freq: Every day | ORAL | Status: DC
  Filled 2018-07-01: qty 1

## 2018-07-01 MED ORDER — FUROSEMIDE 40 MG PO TABS
40.0000 mg | ORAL_TABLET | Freq: Every day | ORAL | Status: DC
  Administered 2018-07-01: 40 mg via ORAL

## 2018-07-01 MED ORDER — CARVEDILOL 6.25 MG PO TABS
6.2500 mg | ORAL_TABLET | Freq: Two times a day (BID) | ORAL | Status: DC
  Administered 2018-07-01 – 2018-07-02 (×3): 6.25 mg via ORAL
  Filled 2018-07-01 (×3): qty 1

## 2018-07-01 MED ORDER — CARVEDILOL 6.25 MG PO TABS: 6.2500 mg | ORAL_TABLET | Freq: Two times a day (BID) | ORAL | Status: DC

## 2018-07-01 MED ORDER — MORPHINE SULFATE (PF) 2 MG/ML IV SOLN
1.0000 mg | Freq: Once | INTRAVENOUS | Status: AC
  Administered 2018-07-01: 1 mg via INTRAVENOUS
  Filled 2018-07-01: qty 1

## 2018-07-01 MED ORDER — POTASSIUM CHLORIDE 20 MEQ/15ML (10%) PO SOLN
40.0000 meq | Freq: Two times a day (BID) | ORAL | Status: AC
  Administered 2018-07-01 – 2018-07-02 (×2): 40 meq via ORAL
  Filled 2018-07-01 (×2): qty 30

## 2018-07-01 MED ORDER — NIFEDIPINE ER OSMOTIC RELEASE 60 MG PO TB24
60.0000 mg | ORAL_TABLET | Freq: Every day | ORAL | Status: DC
  Administered 2018-07-02: 60 mg via ORAL
  Filled 2018-07-01 (×2): qty 1

## 2018-07-01 MED ORDER — LABETALOL HCL 5 MG/ML IV SOLN
10.0000 mg | INTRAVENOUS | Status: DC | PRN
  Administered 2018-07-01 – 2018-07-03 (×3): 10 mg via INTRAVENOUS
  Filled 2018-07-01 (×3): qty 4

## 2018-07-01 MED ORDER — FUROSEMIDE 40 MG PO TABS
60.0000 mg | ORAL_TABLET | Freq: Every day | ORAL | Status: DC
  Administered 2018-07-02: 60 mg via ORAL
  Filled 2018-07-01 (×2): qty 1

## 2018-07-01 MED ORDER — SODIUM CHLORIDE 0.9 % IV SOLN
1.5000 g | Freq: Two times a day (BID) | INTRAVENOUS | Status: DC
  Administered 2018-07-01 – 2018-07-05 (×7): 1.5 g via INTRAVENOUS
  Filled 2018-07-01 (×9): qty 1.5

## 2018-07-01 MED ORDER — JEVITY 1.2 CAL PO LIQD
1000.0000 mL | ORAL | Status: DC
  Administered 2018-07-01 – 2018-07-04 (×3): 1000 mL
  Filled 2018-07-01 (×7): qty 1000

## 2018-07-01 NOTE — Progress Notes (Signed)
Subjective: Gina Boone continues to have difficulty speaking and forming words, and we communicated mostly through nodding yes or no. She is not having any pain at this time. She continues to have very limited movement on her right side. She denies SOB or dyspnea but still has a slight cough. She has right lid droop and appears to have difficulty keeping her eyes open but denies feeling tired.  Patient has right leg swelling that she says is chronic and has been there for years. The area is tender with pressure.  We discussed the possibility of CIR, and how she would like to proceed with nutrition. She agreed a cor-trak would be best for now, and we will discuss further with her daughter.     Objective:  Vital signs in last 24 hours: Vitals:   06/30/18 2020 07/01/18 0020 07/01/18 0312 07/01/18 0811  BP: (!) 221/196 (!) 223/96 (!) 189/75 (!) 220/77  Pulse: 93 92 83 85  Resp: (!) 24 (!) 29 (!) 25 (!) 28  Temp: 98.3 F (36.8 C)   98.3 F (36.8 C)  TempSrc: Axillary   Axillary  SpO2: 95% 94% 94% 93%  Weight:      Height:       Physical Exam  Constitution: patient lying supine in bed, appears frail, she is difficult to understand and has significant dysarthria HENT: normocephalic, atraumatic, +hearing b/l Eyes: EOM intact, right lid droop Cardio: regular rate & rhythm, normal heart sounds, +radial pulse bilaterally  Respiratory: decreased breath sounds on left, crackles left, CTA on right Abdominal: non-distended, +bs, NTTP Neuro: Strength 0/5 RUE, 1/5 LLE, LUE & LLE weak but likely baseline; no loss of sensation UE & LE bilaterally CN II-IV, VI, VIII, intact. CN V right facial droop CN IX difficulty swallowing CN XI 4/5 right rotation of head and right shoulder shrug; 5/5 on left CN XII +tonge movement but appears limited MSK: right leg swelling, no erythema or palpable chords, TTP in calf Skin: dry and intact    Assessment/Plan:  Active Problems:   Cerebral thrombosis with  cerebral infarction   Left pontine stroke (HCC)   Aspiration into airway   Dysarthria   Essential hypertension   Pure hypercholesterolemia  [name] with recent history of probable SAH 6/23, TIA one week ago who presented 7/9 with right hemiparesis in the UE & LE & right facial droop.   #CVA: Patient has slight movement of lower right leg today. She continues to have right sided facial droop and RT determined she is severe aspiration risk through assessment and objective barium swallow study 7/9. Initial CT was negative for [facility], MRI showed left vertebral artery occlusion and left pontine infarct. Echo showed LVEF 55-60%.   - transfer to CIR if approved - Neurology on board  - asa restarted - 300mg  qd suppository - cor-trak placed today - restart BP medications - D5 .45% 32mL/hr   #Cough secondary to aspiration: CXR 7/9 demonstrates barium aspirated into the right lung  - discuss use of EMST with RT - monitor - patient high risk for aspiration pneumonia - start unasyn if patient becomes hypoxic  #Right leg swelling: chronic swelling per pt. Calf is tender to palpation. Pt is high risk for DVT but anti-coagulation previously has been held due to possible previous SAH.   - SCDs in place  - doppler US pending  - consider anti-coagulation with lovenox    #CKD stage III: History of CKD. Cr 1.18 today (1.27 yesterday)    #  Hypokalemic: 3.3 (3.2 yesterday), Magnesium 2.3 - 40mEq oral solution bid today - D5 .45% 7475mL/hr   #Hyperlipidemia: lipid panel cholest 248, LDL 171 - holding niacin   Dispo: Anticipated discharge in approximately 1 days.   Gina Boone, Gina Boone A, DO 07/01/2018, 9:42 AM Pager: [phone]

## 2018-07-01 NOTE — Progress Notes (Signed)
PT Cancellation Note  Patient Details Name: Gina Boone MRN: [id number] DOB: [birth year]   Cancelled Treatment:    Reason Eval/Treat Not Completed: Medical issues which prohibited therapy; BP after meds 199 systolic, SpO2 in 80's and RR 38.  Will attempt another day   Elray McgregorCynthia Mitcheal Sweetin 07/01/2018, 5:08 PM  Sheran Lawlessyndi Amalya Salmons, [place]PT [phone] 07/01/2018

## 2018-07-01 NOTE — Progress Notes (Addendum)
STROKE TEAM PROGRESS NOTE   INTERVAL HISTORY Her daughter is at the bedside.  She continues to have dysarthria, dysphagia and right hemiparesis. She has not been able to pass a swallow eval.  Vitals:   06/30/18 2020 07/01/18 0020 07/01/18 0312 07/01/18 0811  BP: (!) 221/196 (!) 223/96 (!) 189/75 (!) 220/77  Pulse: 93 92 83 85  Resp: (!) 24 (!) 29 (!) 25 (!) 28  Temp: 98.3 F (36.8 C)   98.3 F (36.8 C)  TempSrc: Axillary   Axillary  SpO2: 95% 94% 94% 93%  Weight:      Height:        CBC:  Recent Labs  Lab 07/14/2018 1729 07/04/2018 1814  WBC  --  4.8  NEUTROABS  --  3.7  HGB 16.0* 14.6  HCT 47.0* 44.7  MCV  --  87.3  PLT  --  341    Basic Metabolic Panel:  Recent Labs  Lab 06/30/18 0338 07/01/18 0528  NA 137 137  K 3.2* 3.3*  CL 91* 98  CO2 31 27  GLUCOSE 107* 150*  BUN 22 18  CREATININE 1.27* 1.18*  CALCIUM 9.6 9.1  MG  --  2.3   Lipid Panel:     Component Value Date/Time   CHOL 248 (H) 07/05/2018 2253   TRIG 89 07/20/2018 2253   HDL 59 07/16/2018 2253   CHOLHDL 4.2 07/22/2018 2253   VLDL 18 06/26/2018 2253   LDLCALC 171 (H) 07/22/2018 2253   HgbA1c:  Lab Results  Component Value Date   HGBA1C 5.7 (H) 07/07/2018   Urine Drug Screen:     Component Value Date/Time   LABOPIA NONE DETECTED 07/14/2018 1930   COCAINSCRNUR NONE DETECTED 06/22/2018 1930   LABBENZ NONE DETECTED 06/23/2018 1930   AMPHETMU NONE DETECTED 07/21/2018 1930   THCU NONE DETECTED 07/22/2018 1930   LABBARB (A) 07/19/2018 1930    Result not available. Reagent lot number recalled by manufacturer.    Alcohol Level     Component Value Date/Time   ETH <10 07/16/2018 1814    IMAGING Ct Angio Head W Or Wo Contrast  Result Date: 06/24/2018 CLINICAL DATA:  84 y/o  F; slurred speech and right-sided deficits. EXAM: CT ANGIOGRAPHY HEAD AND NECK CT PERFUSION BRAIN TECHNIQUE: Multidetector CT imaging of the head and neck was performed using the standard protocol during bolus  administration of intravenous contrast. Multiplanar CT image reconstructions and MIPs were obtained to evaluate the vascular anatomy. Carotid stenosis measurements (when applicable) are obtained utilizing NASCET criteria, using the distal internal carotid diameter as the denominator. Multiphase CT imaging of the brain was performed following IV bolus contrast injection. Subsequent parametric perfusion maps were calculated using RAPID software. CONTRAST:  179m ISOVUE-370 IOPAMIDOL (ISOVUE-370) INJECTION 76% COMPARISON:  06/22/2018 CT head. FINDINGS: CTA NECK FINDINGS Aortic arch: Standard branching. Imaged portion shows no evidence of aneurysm or dissection. Left subclavian artery origin and downstream tandem segments of 50% moderate stenosis with predominant fibrofatty plaque. Moderate mixed plaque of the aortic arch. Right carotid system: No evidence of dissection, stenosis (50% or greater) or occlusion. Calcified plaque of the carotid bifurcation with mild less than 50% proximal ICA stenosis. Left carotid system: No evidence of dissection, stenosis (50% or greater) or occlusion. Calcified plaque of the carotid bifurcation with mild less 50% proximal ICA stenosis. Vertebral arteries: Widely patent right dominant vertebral artery. Proximal left vertebral artery is patent with several segments of stenosis. The downstream vessel as a gradient decrease in attenuation  with occlusion at the V3 segment. The left vertebral artery between basilar PICA is patent although diminutive. Skeleton: Moderate cervical spondylosis with multilevel disc and facet degenerative changes. Grade 1 C4-5 anterolisthesis. No high-grade bony canal stenosis. Other neck: Subcentimeter thyroid nodules. Upper chest: Mild biapical pleuroparenchymal scarring. Review of the MIP images confirms the above findings CTA HEAD FINDINGS Anterior circulation: Calcified plaque of the carotid siphons with moderate 50% bilateral paraclinoid stenosis. No large  vessel occlusion, aneurysm, or additional segment of high-grade stenosis. Posterior circulation: Patent right vertebral artery. Occluded left V3 and V4 to the PICA origin. The left vertebral artery between the vertebrobasilar junction and PICA origin is patent and diminutive. Basilar artery is patent with lumen irregularity of multiple segments of mild stenosis. Patent bilateral PCA. Right distal P2 segment of moderate stenosis and bilateral proximal P2 segments of mild stenosis. Venous sinuses: As permitted by contrast timing, patent. Anatomic variants: Fetal right PCA. Probable small anterior communicating artery. No left posterior communicating artery identified, likely hypoplastic or absent. Review of the MIP images confirms the above findings CT Brain Perfusion Findings: CBF (<30%) Volume: 8m Perfusion (Tmax>6.0s) volume: 034mMismatch Volume: 64m106mnfarction Location:Negative. IMPRESSION: 1. Normal CT brain perfusion. 2. 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 Patent diminutive left vertebral artery between vertebrobasilar junction and left PICA origin. 3. Patent carotid systems and right vertebral artery without dissection or high-grade stenosis. 4. Patent circle-of-Willis, bilateral ACA, bilateral MCA, and bilateral PCA. No large vessel occlusion or aneurysm. 5. Calcified plaque of carotid bifurcations with bilateral proximal ICA mild less 50% stenosis. 6. Calcified plaque of carotid siphons with moderate bilateral paraclinoid stenosis. These results were called by telephone at the time of interpretation on 07/18/2018 at 6:21 pm to Dr. MCNRoland Rackwho verbally acknowledged these results. Electronically Signed   By: LanKristine GarbeD.   On: 06/27/2018 18:21   Ct Angio Neck W Or Wo Contrast  Result Date: 06/24/2018 CLINICAL DATA:  [age]  F; slurred speech and right-sided deficits. EXAM: CT ANGIOGRAPHY HEAD AND NECK CT PERFUSION BRAIN  TECHNIQUE: Multidetector CT imaging of the head and neck was performed using the standard protocol during bolus administration of intravenous contrast. Multiplanar CT image reconstructions and MIPs were obtained to evaluate the vascular anatomy. Carotid stenosis measurements (when applicable) are obtained utilizing NASCET criteria, using the distal internal carotid diameter as the denominator. Multiphase CT imaging of the brain was performed following IV bolus contrast injection. Subsequent parametric perfusion maps were calculated using RAPID software. CONTRAST:  1064m73mOVUE-370 IOPAMIDOL (ISOVUE-370) INJECTION 76% COMPARISON:  07/06/2018 CT head. FINDINGS: CTA NECK FINDINGS Aortic arch: Standard branching. Imaged portion shows no evidence of aneurysm or dissection. Left subclavian artery origin and downstream tandem segments of 50% moderate stenosis with predominant fibrofatty plaque. Moderate mixed plaque of the aortic arch. Right carotid system: No evidence of dissection, stenosis (50% or greater) or occlusion. Calcified plaque of the carotid bifurcation with mild less than 50% proximal ICA stenosis. Left carotid system: No evidence of dissection, stenosis (50% or greater) or occlusion. Calcified plaque of the carotid bifurcation with mild less 50% proximal ICA stenosis. Vertebral arteries: Widely patent right dominant vertebral artery. Proximal left vertebral artery is patent with several segments of stenosis. The downstream vessel as a gradient decrease in attenuation with occlusion at the V3 segment. The left vertebral artery between basilar PICA is patent although diminutive. Skeleton: Moderate cervical spondylosis with multilevel disc and facet degenerative changes. Grade  1 C4-5 anterolisthesis. No high-grade bony canal stenosis. Other neck: Subcentimeter thyroid nodules. Upper chest: Mild biapical pleuroparenchymal scarring. Review of the MIP images confirms the above findings CTA HEAD FINDINGS Anterior  circulation: Calcified plaque of the carotid siphons with moderate 50% bilateral paraclinoid stenosis. No large vessel occlusion, aneurysm, or additional segment of high-grade stenosis. Posterior circulation: Patent right vertebral artery. Occluded left V3 and V4 to the PICA origin. The left vertebral artery between the vertebrobasilar junction and PICA origin is patent and diminutive. Basilar artery is patent with lumen irregularity of multiple segments of mild stenosis. Patent bilateral PCA. Right distal P2 segment of moderate stenosis and bilateral proximal P2 segments of mild stenosis. Venous sinuses: As permitted by contrast timing, patent. Anatomic variants: Fetal right PCA. Probable small anterior communicating artery. No left posterior communicating artery identified, likely hypoplastic or absent. Review of the MIP images confirms the above findings CT Brain Perfusion Findings: CBF (<30%) Volume: 53m Perfusion (Tmax>6.0s) volume: 061mMismatch Volume: 65m22mnfarction Location:Negative. IMPRESSION: 1. Normal CT brain perfusion. 2. 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 Patent diminutive left vertebral artery between vertebrobasilar junction and left PICA origin. 3. Patent carotid systems and right vertebral artery without dissection or high-grade stenosis. 4. Patent circle-of-Willis, bilateral ACA, bilateral MCA, and bilateral PCA. No large vessel occlusion or aneurysm. 5. Calcified plaque of carotid bifurcations with bilateral proximal ICA mild less 50% stenosis. 6. Calcified plaque of carotid siphons with moderate bilateral paraclinoid stenosis. These results were called by telephone at the time of interpretation on 06/23/2018 at 6:21 pm to Dr. MCNRoland Rackwho verbally acknowledged these results. Electronically Signed   By: LanKristine GarbeD.   On: 06/23/2018 18:21   Mr Brain Wo Contrast  Result Date: 07/17/2018 CLINICAL DATA:  [age] F;  slurred speech, right facial droop, right-sided weakness. EXAM: MRI HEAD WITHOUT CONTRAST MRA HEAD WITHOUT CONTRAST TECHNIQUE: Multiplanar, multiecho pulse sequences of the brain and surrounding structures were obtained without intravenous contrast. Angiographic images of the head were obtained using MRA technique without contrast. COMPARISON:  07/04/2018 CT head, CT perfusion head, CT angiogram head. FINDINGS: MRI HEAD FINDINGS Brain: Linear reduced diffusion within the left paramedian pons compatible with acute/early subacute infarction and corresponding to hypoattenuation on CT (series 6 image 10-14). No associated hemorrhage or mass effect. Stable mild chronic microvascular ischemic changes and parenchymal volume loss of the brain for age. No abnormal susceptibility hypointensity to indicate intracranial hemorrhage. No extra-axial collection, hydrocephalus, focal mass effect, or herniation. Vascular: As below. Skull and upper cervical spine: Normal marrow signal. Sinuses/Orbits: Negative. Other: None. MRA HEAD FINDINGS Internal carotid arteries: Irregularity of bilateral carotid siphons with low signal calcified plaque with moderate bilateral paraclinoid stenosis. Anterior cerebral arteries:  Patent. Middle cerebral arteries: Patent. Anterior communicating artery: Patent. Posterior communicating arteries: Fetal right PCA. No left posterior communicating artery identified, likely hypoplastic or absent. Posterior cerebral arteries:  Patent. Basilar artery: Diffuse irregularity with multiple segments of mild stenosis. Vertebral arteries: Patent right vertebral artery. Diminutive left vertebral artery between the vertebrobasilar junction and left PICA origin. No appreciable vertebral artery below the level of the PICA, better characterized on prior CT angiogram. IMPRESSION: 1. Left paramedian pontine acute/early subacute infarction. No associated hemorrhage or mass effect. 2. Occluded left vertebral artery below the  PICA origin, more completely characterized on prior CT angiogram of the head and neck. No additional intracranial large vessel occlusion. 3. Stable chronic microvascular ischemic changes and parenchymal  volume loss of the brain. These results were called by telephone at the time of interpretation on 07/15/2018 at 9:08 pm to Dr. Nanda Quinton , who verbally acknowledged these results. Electronically Signed   By: Kristine Garbe M.D.   On: 07/03/2018 21:11   Ct Cerebral Perfusion W Contrast  Result Date: 07/10/2018 CLINICAL DATA:  84 y/o  F; slurred speech and right-sided deficits. EXAM: CT ANGIOGRAPHY HEAD AND NECK CT PERFUSION BRAIN TECHNIQUE: Multidetector CT imaging of the head and neck was performed using the standard protocol during bolus administration of intravenous contrast. Multiplanar CT image reconstructions and MIPs were obtained to evaluate the vascular anatomy. Carotid stenosis measurements (when applicable) are obtained utilizing NASCET criteria, using the distal internal carotid diameter as the denominator. Multiphase CT imaging of the brain was performed following IV bolus contrast injection. Subsequent parametric perfusion maps were calculated using RAPID software. CONTRAST:  175m ISOVUE-370 IOPAMIDOL (ISOVUE-370) INJECTION 76% COMPARISON:  06/22/2018 CT head. FINDINGS: CTA NECK FINDINGS Aortic arch: Standard branching. Imaged portion shows no evidence of aneurysm or dissection. Left subclavian artery origin and downstream tandem segments of 50% moderate stenosis with predominant fibrofatty plaque. Moderate mixed plaque of the aortic arch. Right carotid system: No evidence of dissection, stenosis (50% or greater) or occlusion. Calcified plaque of the carotid bifurcation with mild less than 50% proximal ICA stenosis. Left carotid system: No evidence of dissection, stenosis (50% or greater) or occlusion. Calcified plaque of the carotid bifurcation with mild less 50% proximal ICA stenosis.  Vertebral arteries: Widely patent right dominant vertebral artery. Proximal left vertebral artery is patent with several segments of stenosis. The downstream vessel as a gradient decrease in attenuation with occlusion at the V3 segment. The left vertebral artery between basilar PICA is patent although diminutive. Skeleton: Moderate cervical spondylosis with multilevel disc and facet degenerative changes. Grade 1 C4-5 anterolisthesis. No high-grade bony canal stenosis. Other neck: Subcentimeter thyroid nodules. Upper chest: Mild biapical pleuroparenchymal scarring. Review of the MIP images confirms the above findings CTA HEAD FINDINGS Anterior circulation: Calcified plaque of the carotid siphons with moderate 50% bilateral paraclinoid stenosis. No large vessel occlusion, aneurysm, or additional segment of high-grade stenosis. Posterior circulation: Patent right vertebral artery. Occluded left V3 and V4 to the PICA origin. The left vertebral artery between the vertebrobasilar junction and PICA origin is patent and diminutive. Basilar artery is patent with lumen irregularity of multiple segments of mild stenosis. Patent bilateral PCA. Right distal P2 segment of moderate stenosis and bilateral proximal P2 segments of mild stenosis. Venous sinuses: As permitted by contrast timing, patent. Anatomic variants: Fetal right PCA. Probable small anterior communicating artery. No left posterior communicating artery identified, likely hypoplastic or absent. Review of the MIP images confirms the above findings CT Brain Perfusion Findings: CBF (<30%) Volume: 022mPerfusion (Tmax>6.0s) volume: 28m65mismatch Volume: 28mL12mfarction Location:Negative. IMPRESSION: 1. Normal CT brain perfusion. 2. 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 Patent diminutive left vertebral artery between vertebrobasilar junction and left PICA origin. 3. Patent carotid systems and right vertebral artery without  dissection or high-grade stenosis. 4. Patent circle-of-Willis, bilateral ACA, bilateral MCA, and bilateral PCA. No large vessel occlusion or aneurysm. 5. Calcified plaque of carotid bifurcations with bilateral proximal ICA mild less 50% stenosis. 6. Calcified plaque of carotid siphons with moderate bilateral paraclinoid stenosis. These results were called by telephone at the time of interpretation on 07/11/2018 at 6:21 pm to Dr. MCNERoland Rackho verbally acknowledged these results.  Electronically Signed   By: Kristine Garbe M.D.   On: 06/26/2018 18:21   Dg Chest Port 1 View  Result Date: 06/30/2018 CLINICAL DATA:  Aspiration during swallowing function test today. EXAM: PORTABLE CHEST 1 VIEW COMPARISON:  Single-view of the chest 04/29/2014. FINDINGS: High density material is seen in the bronchial tree, greater on the left consistent with known aspiration. No focal airspace disease. No pneumothorax or pleural effusion. Heart size is normal. IMPRESSION: Barium in the bronchial tree is more blunted on the left and consistent with aspiration today. Lungs are clear. Electronically Signed   By: Inge Rise M.D.   On: 06/30/2018 15:19   Dg Swallowing Func-speech Pathology  Result Date: 06/30/2018 Objective Swallowing Evaluation: Type of Study: MBS-Modified Barium Swallow Study  Patient Details Name: Gina Boone MRN: [id number] Date of Birth: [birth year] Today's Date: 06/30/2018 Time: SLP Start Time (ACUTE ONLY): 1208 -SLP Stop Time (ACUTE ONLY): 1232 SLP Time Calculation (min) (ACUTE ONLY): 24 min Past Medical History: Past Medical History: Diagnosis Date . Hypertension  . Kidney stones  . Stroke [facility])  Past Surgical History: Past Surgical History: Procedure Laterality Date . APPENDECTOMY  2012 . renal stents   HPI: Brendaliz Kuk is a 84 y.o. female with a history of recent subarachnoid hemorrhage who presents with Right-sided weakness.  Found to have infarct in left paramedian pons.   No data recorded  Assessment / Plan / Recommendation CHL IP CLINICAL IMPRESSIONS 06/30/2018 Clinical Impression  Pt demonstrates a severe pharyngeal dysphagia following brainstem CVA; limited study due to severity, only three sips given. Deficits include delayed swallow initiation and airway protection, decreased base of tongue retraction and pharyngeal persitalsis, particulary of the upper pharyngeal constrictor. There is aspiration of nectar thick liquids before the swallow with sensation and severe residue in the upper oropharynx with oral and nasal regurgitation and subsequent aspiration of spilling residue.  Strongly suspect weakness of lateral pharyngeal wall, possibly on the right with potential for paresis/paralysis of unilateral arytenoid.  Trialed a head turn left with increased partial peristalsis of bolus, though severe aspiration of residue continued to occur. Pt could not tolerate further trials. Pt at high risk of aspiration with all PO and of secretions. Plan to target strength of cough and pharyngeal strength in trial of acute therapy, though potential for improvement in the short term is guarded. Would recommend f/u FEES for direct visualization of ROM of oropharyngeal musculature.  SLP Visit Diagnosis Dysphagia, pharyngeal phase (R13.13) Attention and concentration deficit following -- Frontal lobe and executive function deficit following -- Impact on safety and function --   CHL IP TREATMENT RECOMMENDATION 06/30/2018 Treatment Recommendations Therapy as outlined in treatment plan below   Prognosis 06/30/2018 Prognosis for Safe Diet Advancement Guarded Barriers to Reach Goals -- Barriers/Prognosis Comment -- CHL IP DIET RECOMMENDATION 06/30/2018 SLP Diet Recommendations NPO;Alternative means - temporary Liquid Administration via -- Medication Administration -- Compensations -- Postural Changes --   CHL IP OTHER RECOMMENDATIONS 06/30/2018 Recommended Consults -- Oral Care Recommendations Oral care QID Other Recommendations --    CHL IP FOLLOW UP RECOMMENDATIONS 06/30/2018 Follow up Recommendations Inpatient Rehab   CHL IP FREQUENCY AND DURATION 06/30/2018 Speech Therapy Frequency (ACUTE ONLY) min 3x week Treatment Duration 2 weeks      CHL IP ORAL PHASE 06/30/2018 Oral Phase WFL Oral - Pudding Teaspoon -- Oral - Pudding Cup -- Oral - Honey Teaspoon -- Oral - Honey Cup -- Oral - Nectar Teaspoon -- Oral - Nectar Cup -- Oral - Nectar  Straw -- Oral - Thin Teaspoon -- Oral - Thin Cup -- Oral - Thin Straw -- Oral - Puree -- Oral - Mech Soft -- Oral - Regular -- Oral - Multi-Consistency -- Oral - Pill -- Oral Phase - Comment --  CHL IP PHARYNGEAL PHASE 06/30/2018 Pharyngeal Phase Impaired Pharyngeal- Pudding Teaspoon -- Pharyngeal -- Pharyngeal- Pudding Cup -- Pharyngeal -- Pharyngeal- Honey Teaspoon Reduced pharyngeal peristalsis;Penetration/Apiration after swallow;Delayed swallow initiation-vallecula;Reduced tongue base retraction;Reduced airway/laryngeal closure;Penetration/Aspiration during swallow;Significant aspiration (Amount);Pharyngeal residue - valleculae;Pharyngeal residue - pyriform;Compensatory strategies attempted (with notebox) Pharyngeal Material enters airway, passes BELOW cords without attempt by patient to eject out (silent aspiration) Pharyngeal- Honey Cup -- Pharyngeal -- Pharyngeal- Nectar Teaspoon -- Pharyngeal -- Pharyngeal- Nectar Cup Reduced pharyngeal peristalsis;Penetration/Apiration after swallow;Reduced tongue base retraction;Reduced airway/laryngeal closure;Penetration/Aspiration during swallow;Significant aspiration (Amount);Pharyngeal residue - valleculae;Pharyngeal residue - pyriform;Compensatory strategies attempted (with notebox);Delayed swallow initiation-pyriform sinuses;Penetration/Aspiration before swallow Pharyngeal Material enters airway, passes BELOW cords and not ejected out despite cough attempt by patient Pharyngeal- Nectar Straw -- Pharyngeal -- Pharyngeal- Thin Teaspoon -- Pharyngeal -- Pharyngeal- Thin  Cup -- Pharyngeal -- Pharyngeal- Thin Straw -- Pharyngeal -- Pharyngeal- Puree -- Pharyngeal -- Pharyngeal- Mechanical Soft -- Pharyngeal -- Pharyngeal- Regular -- Pharyngeal -- Pharyngeal- Multi-consistency -- Pharyngeal -- Pharyngeal- Pill -- Pharyngeal -- Pharyngeal Comment --  No flowsheet data found. No flowsheet data found. DeBlois, Katherene Ponto 06/30/2018, 3:14 PM              Mr Jodene Nam Head Wo Contrast  Result Date: 07/03/2018 CLINICAL DATA:  84 y/o F; slurred speech, right facial droop, right-sided weakness. EXAM: MRI HEAD WITHOUT CONTRAST MRA HEAD WITHOUT CONTRAST TECHNIQUE: Multiplanar, multiecho pulse sequences of the brain and surrounding structures were obtained without intravenous contrast. Angiographic images of the head were obtained using MRA technique without contrast. COMPARISON:  07/02/2018 CT head, CT perfusion head, CT angiogram head. FINDINGS: MRI HEAD FINDINGS Brain: Linear reduced diffusion within the left paramedian pons compatible with acute/early subacute infarction and corresponding to hypoattenuation on CT (series 6 image 10-14). No associated hemorrhage or mass effect. Stable mild chronic microvascular ischemic changes and parenchymal volume loss of the brain for age. No abnormal susceptibility hypointensity to indicate intracranial hemorrhage. No extra-axial collection, hydrocephalus, focal mass effect, or herniation. Vascular: As below. Skull and upper cervical spine: Normal marrow signal. Sinuses/Orbits: Negative. Other: None. MRA HEAD FINDINGS Internal carotid arteries: Irregularity of bilateral carotid siphons with low signal calcified plaque with moderate bilateral paraclinoid stenosis. Anterior cerebral arteries:  Patent. Middle cerebral arteries: Patent. Anterior communicating artery: Patent. Posterior communicating arteries: Fetal right PCA. No left posterior communicating artery identified, likely hypoplastic or absent. Posterior cerebral arteries:  Patent. Basilar artery:  Diffuse irregularity with multiple segments of mild stenosis. Vertebral arteries: Patent right vertebral artery. Diminutive left vertebral artery between the vertebrobasilar junction and left PICA origin. No appreciable vertebral artery below the level of the PICA, better characterized on prior CT angiogram. IMPRESSION: 1. Left paramedian pontine acute/early subacute infarction. No associated hemorrhage or mass effect. 2. 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 No additional intracranial large vessel occlusion. 3. Stable chronic microvascular ischemic changes and parenchymal volume loss of the brain. These results were called by telephone at the time of interpretation on 06/25/2018 at 9:08 pm to Dr. Nanda Quinton , who verbally acknowledged these results. Electronically Signed   By: Kristine Garbe M.D.   On: 07/06/2018 21:11   Ct Head  Code Stroke Wo Contrast  Result Date: 06/28/2018 CLINICAL DATA:  Code stroke. Right-sided facial droop and slurred speech. EXAM: CT HEAD WITHOUT CONTRAST TECHNIQUE: Contiguous axial images were obtained from the base of the skull through the vertex without intravenous contrast. COMPARISON:  None. FINDINGS: Brain: Linear lucency in left hemi pons may represent a pontine perforator infarction, age indeterminate. No additional acute infarction identified. No hemorrhage, extra-axial collection, hydrocephalus, or herniation. Mild for age chronic microvascular ischemic changes and parenchymal volume loss of the brain. Small chronic lacunar infarct in the right putamen. Vascular: Calcific atherosclerosis of the carotid siphons. No hyperdense vessel identified. Skull: Normal. Negative for fracture or focal lesion. Sinuses/Orbits: No acute finding. Other: None. ASPECTS [facility] Stroke Program Early CT Score) - Ganglionic level infarction (caudate, lentiform nuclei, internal capsule, insula, M1-M3  cortex): 7 - Supraganglionic infarction (M4-M6 cortex): 3 Total score (0-10 with 10 being normal): 10 IMPRESSION: 1. Linear lucency in left paramedian pons compatible with pontine perforator infarct, age indeterminate. 2. Mild chronic microvascular ischemic changes and parenchymal volume loss of the brain for age. 3. ASPECTS is 10 These results were called by telephone at the time of interpretation on 07/11/2018 at 5:37 pm to Dr. Leonel Ramsay, who verbally acknowledged these results. Electronically Signed   By: Kristine Garbe M.D.   On: 06/28/2018 17:39    PHYSICAL EXAM  Frail elderly Caucasian lady currently not in distress. . Afebrile. Head is nontraumatic. Neck is supple without bruit.    Cardiac exam no murmur or gallop. Lungs are clear to auscultation. Distal pulses are well felt. Neurological Exam :  Awake alert oriented 3 severely dysarthric speech Diminished attention, registration and recall. Extraocular moments are full range without nystagmus. Blinks to threat bilaterally. Fundi not visualized. Mild right lower facial weakness. Tongue midline. Motor system exam shows right hemiplegia with right upper extremity grade 1-2/5 strength. Right lower extremity strength is 2-3/5. Tone is diminished on the right compared to the left. Sensation is slightly impaired on the right compared to the left. Deep tendon reflexes are depressed on the right and present on the left. Right plantar upgoing left downgoing. Gait not tested.  ASSESSMENT/PLAN [name] is a 84 y.o. female with history of HTN, TIA July 2019, [facility] June 2018 presenting with right hemiparesis.   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  Code Stroke CT head No acute stroke. Old left paramedian pontine infacrt Small vessel disease.  Atrophy. ASPECTS 10.   CTA  head & neck: occlusion of left vertebral artery V3 and proximal V4 age indeterminate, B ICA < 50%  CT perfusion Normal brain perfusion  MRI  Left paramedian pontine acute/ early subacute infarct, no hemorrhage,  MRA  Occluded Left VA  2D Echo  Left ventricle: The cavity size was at the lower limits of normal. Wall thickness was increased in a pattern of moderate LVH. There was moderate to severe focal basal hypertrophy of the septum. Systolic function was normal. The estimated ejection fraction was in the range of 55% to 60%. Wall motion was normal; there were no regional wall motion abnormalities LDL 171  HgbA1c 5.7  SCDs for VTE prophylaxis  No antithrombotic prior to admission (had been on aspirin 81 mg daily prior to admission in June in [facility] for a [place]. SAH cleared as of 06/26/18 on CT done at [facility]. Pt  now on aspirin 300 mg suppository daily.   Therapy recommendations:  pending . Used walker since 06/14/2018 admission. Had [facility] PT at home. SLP and OT planned but never got there. Likely will need IP rehab this admission  Disposition:  pending . Has been staying with her daughter since 06/14/2018 hospital admission.  Dyspphagia  Secondary to stroke Diet Order           Diet NPO time specified  Diet effective now         SLP to assess  For MBSS today  Hypertension  Stable . Permissive hypertension (OK if < 220/120) but gradually normalize in 5-7 days . Long-term BP goal normotensive  Hyperlipidemia  Home meds:  none,   LDL 171, goal < 70  Add statin once able to swallow  Other Stroke Risk Factors  Advanced age  Hx stroke/TIA  06/26/2018 - TIA, previous SAH seen now resolved  06/14/2018 [facility] R frontal cortical  Other Active Problems  Right eye pain Add topamax 25 mg bid ( check ESR)-pending  CKD stage 3  Hospital day # 2   I have personally examined this patient, reviewed notes, independently viewed imaging studies, participated in medical decision  making and plan of care.ROS completed by me personally and pertinent positives fully documented  I have made any additions or clarifications directly to the above note.    She has presented with right hemiplegia due to left pontine infarct likely from small vessel disease. She recently had a nontraumatic right frontal subarachnoid hemorrhage 2 weeks ago and aspirin was stopped. A week ago she had a possible TIA and CT scan at that visit showed complete resolution of subarachnoid hemorrhage. She remains at risk for recurrent strokes and will benefit with starting antiplatelet therapy with aspirin. Agree with starting tube feeds and placing cortrack tube for now. Transfer to inpatient rehabilitation when bed available.Long discussion with the patient and daughter about risk-benefit of bleeding with aspirin and my recommendation at the present time is to start aspirin. D/w Dr Hayden Rasmussen.  Greater than 50% time during this 25 minute visit was spent on counseling and coordination of care about her lacunar stroke, recent subarachnoid hemorrhage and answering questions.stroke team will sign off. Kindly call for questions.  Antony Contras, MD Medical Director [place] Pager: [phone] 07/01/2018 1:21 PM  To contact Stroke Continuity provider, please refer to [email or web address] After hours, contact General Neurology

## 2018-07-01 NOTE — Progress Notes (Signed)
OT Cancellation Note  Patient Details Name: Gina Boone MRN: [id number] DOB: Jul 22, 1929   Cancelled Treatment:    Reason Eval/Treat Not Completed: Medical issues which prohibited therapy(BP issues, Pt with hypoxic episode). OT will continue to follow.  Evern BioLaura J Jaanai Salemi 07/01/2018, 2:34 PM  Sherryl MangesLaura Elohim Brune OTR/L [phone]

## 2018-07-01 NOTE — Progress Notes (Signed)
Inpatient Rehabilitation Admissions Coordinator  Patient's daughter is not at bedside this morning. I will follow up upon her arrival to begin discussing rehab options.  Ottie GlazierBarbara Dallen Bunte, RN, MSN Rehab Admissions Coordinator [phone] 07/01/2018 9:22 AM

## 2018-07-01 NOTE — Progress Notes (Signed)
Initial Nutrition Assessment  DOCUMENTATION CODES:   Underweight, Non-severe (moderate) malnutrition in context of chronic illness  INTERVENTION:  Receiving verbal consult to begin tubefeeding from Dr. Pearlean BrownieSethi  Begin Jevity 1.2 at 120mL/hr, increase by 10 every 12 hours to goal rate of 6850mL/hr  At goal, provides 1440 calories, 67 grams of protein, and 968mL free water  Check Mg, PO4, with the beginning of tubefeed and every 12 hours threreafter until feeding reaches goal rate  Replete K+ (3.3)  Recommend remove D5 from fluids with tubefeeding initiation.   NUTRITION DIAGNOSIS:   Moderate Malnutrition related to chronic illness as  evidenced by mild fat depletion, mild muscle depletion.  GOAL:   Patient will meet greater than or equal to 90% of their needs  MONITOR:   TF tolerance, PO intake  REASON FOR ASSESSMENT:   New TF    ASSESSMENT:   Patient with recent history of probable SAH 6/23, TIA one week ago presented on 7/9 with R-sided weakness in her upper and lower extremities, and right facial droop, found to have CVA. She has failed swallow studies and aspirated barium into her right lung. Cortrak tube to be placed today  Spoke with patient's daughter at bedside. Patient was unable to provide any history.  Daughter states that since patient had a possible SAH and TIA on 6/23 she been providing patient with soft foods. It seemed to daughter that patient was getting tired from chewing. Was eating yogurt for breakfast, something like hamburger helper or salmon with a starch and a vegetable for lunch and chicken and rice for dinner. Also drinks two ensure per day and if patient does not feel hungry, daughter gets patient to drink ensure in place of meals. Daughter reports a UBW of 114 pounds; it appears she was this weight when she presented to the ED on 06/26/2018. She is now 106 pounds, indicating an 8 pound/7% severe weight loss over 5 days.  She was ambulating without a  walker or cane prior to 6/23 admission. She has been using a walker since then.  Now has a cortrak tube placed. Received verbal consult from Dr. Pearlean BrownieSethi to begin tube feeding.   Labs reviewed:  K+ 3.3 Medications reviewed and include:  Vitamin D, Vitamin B12 D5 1/2 NS at 7575mL/hr --> 306 calories   NUTRITION - FOCUSED PHYSICAL EXAM:    Most Recent Value  Orbital Region  Mild depletion  Upper Arm Region  No depletion  Thoracic and Lumbar Region  Mild depletion  Buccal Region  Mild depletion  Temple Region  Mild depletion  Clavicle Bone Region  Severe depletion  Clavicle and Acromion Bone Region  Moderate depletion  Scapular Bone Region  Unable to assess  Dorsal Hand  Mild depletion  Patellar Region  Mild depletion  Anterior Thigh Region  Mild depletion  Posterior Calf Region  Mild depletion  Edema (RD Assessment)  Mild  Hair  Reviewed  Eyes  Reviewed  Mouth  Reviewed  Skin  Reviewed  Nails  Reviewed       Diet Order:   Diet Order           Diet NPO time specified  Diet effective now          EDUCATION NEEDS:   No education needs have been identified at this time  Skin:  Skin Assessment: Reviewed RN Assessment  Last BM:  07/01/2018  Height:   Ht Readings from Last 1 Encounters:  07/14/2018 5\' 4"  (1.626 m)  Weight:   Wt Readings from Last 1 Encounters:  Jul 19, 2018 106 lb 14.8 oz (48.5 kg)    Ideal Body Weight:  54.54 kg  BMI:  Body mass index is 18.35 kg/m.  Estimated Nutritional Needs:   Kcal:  1400-1600 calories  Protein:  63-72 grams (1.3-1.5g/kg)  Fluid:  >1.5L    Gina Boone. Gina Arciga, MS, RD LDN Inpatient Clinical Dietitian Pager [phone]

## 2018-07-01 NOTE — Care Management Important Message (Signed)
Important Message  Patient Details  Name: Gina Boone MRN: [id number] Date of Birth: 08/24/1929   Medicare Important Message Given:  Yes    Kayode Petion 07/01/2018, 1:45 PM 

## 2018-07-01 NOTE — Progress Notes (Addendum)
Paged by RN for worsening hypoxia and increased work of breathing. RT concerned that patient may need to be intubated. Went to bedside to evaluate her. She is on 15L non-rebreather, currently maintaining her saturations in the mid 90s however is very tachypneic with a RR in the 30s, and working very hard to breath. On exam she is alert and answering yes no questions appropriately, on auscultation she had bibasilar crackles. Family at bedside again reiterated wanting to pursue aggressive care and would like to proceed with intubation if necessary. Confirmed full code status with patient and family. We have contacted PCCM and are obtaining blood gas. Will follow up on further recommendations. Appreciate assistance.   Addendum: Patients ABG results were consistent with an acute respiratory alkalosis (pH 7.535, CO2 29.1, PO2 83.9, Bicarb 24.4, on 15L non-rebreather). Patient maintaining oxygenation now. Discussed with PCCM, will continue to monitor patient for now. Will call critical care back if clinical status declines and proceed with intubation if necessary.   Claudean SeveranceMarissa M Krienke, M.D. PGY1 Pager [phone] 07/01/2018 11:29 PM

## 2018-07-01 NOTE — Progress Notes (Signed)
Inpatient Rehabilitation Admissions Coordinator  I met with patient , her daughter, sister and brother in law at bedside. I discussed goals and expectations of an inpt rehab admission. Daughter is in agreement to admit. Cortrak placed and RN, Kasandra Knudsen, restarted meds via cortrak . I will await medical readiness to plan admit to CIR.  Danne Baxter, RN, MSN Rehab Admissions Coordinator [phone] 07/01/2018 12:58 PM

## 2018-07-01 NOTE — Progress Notes (Signed)
  Speech Language Pathology Treatment: Dysphagia  Patient Details Name: Gina Boone MRN: [id number] DOB: 07-Aug-1929 Today's Date: 07/01/2018 Time: [id number] SLP Time Calculation (min) (ACUTE ONLY): 43 min  Assessment / Plan / Recommendation Clinical Impression  Pt significantly weaker today, RR in 30s and SpO2 in mid 80s. Repositioned pt, provided oral care and suctioned mucous plug from oral cavity but vitals did not improve. Pt could not initiate throat clear or cough to clear audible secretions. Strongly suspect ongoing aspiration of standing pharyngeal secretions, likely hardening and thickening with NPO status. Pt not capable of starting Expiratory Muscle Strength training with me today. Given status would encourage referral to Palliative care as severity of dysphagia is  impacting airway protection. Will follow for needs.   HPI HPI: Gina Boone is a 84 y.o. female with a history of recent subarachnoid hemorrhage who presents with Right-sided weakness.  Found to have infarct in left paramedian pons.        SLP Plan  Continue with current plan of care       Recommendations  Diet recommendations: NPO                Plan: Continue with current plan of care       GO               [facility] Allegiance HealthBonnie Grayton Lobo, MA CCC-SLP [phone]  Claudine MoutonDeBlois, Rakhi Romagnoli [name] 07/01/2018, 2:31 PM

## 2018-07-01 NOTE — Progress Notes (Signed)
Cortrak Tube Team Note:  Consult received to place a Cortrak feeding tube.   A 10 F Cortrak tube was placed in the RIGHT nare and secured with a nasal bridle at 84 cm. Per the Cortrak monitor reading the tube tip is post-pyloric.   No x-ray is required. RN may begin using tube.   If the tube becomes dislodged please keep the tube and contact the Cortrak team at [email or web address] ([email or web address]) for replacement.  If after hours and replacement cannot be delayed, place a NG tube and confirm placement with an abdominal x-ray.    Jene Huq MS, RD, LDN, CNSC [phone] Pager  [phone] Weekend/On-Call Pager   

## 2018-07-01 NOTE — Care Management Important Message (Signed)
Important Message  Patient Details  Name: Gina Boone MRN: [id number] Date of Birth: August 21, 1929   Medicare Important Message Given:  Yes    Berta Denson 07/01/2018, 1:45 PM

## 2018-07-01 NOTE — Progress Notes (Signed)
Internal Medicine Attending  Date: 07/01/2018  Patient name: Gina Boone Medical record number: [id number] Date of birth: [birth year] Age: 84 y.o. Gender: female  I saw and evaluated the patient. I reviewed the resident's note by Dr. Cleaster CorinSeawell and I agree with the resident's findings and plans as documented in her progress note.  When seen on rounds this morning [name] had a blunted affect and remains severely dysarthric. Physical examination revealed flaccid paralysis of the right upper extremity and 1/5 strength in the right lower extremity. She still had significant difficulty coordinating her tongue movements. Later this afternoon she likely had another aspiration event given her inability to protect her airway. She became hypoxemic and tachypneic. Suctioning did reveal some secretions. A portable chest x-ray was notable for a left lower lobe infiltrate in the basilar segments as well as an early infiltrate in the superior subsegment of the left lower lobe. There may be some early infiltrate in the right base is well. This is all consistent with aspiration pneumonia and is concerning prognostically. We will continue with aggressive pulmonary toilet and have started Unasyn for her aspiration pneumonia. The plan is to intubate the patient should she require it per the daughter's wishes as they await the arrival of the final daughter from out of town. The family should all be here tomorrow morning and goals of care discussion can take place with palliative care at that time.

## 2018-07-01 NOTE — Progress Notes (Signed)
ANTIBIOTIC CONSULT NOTE - INITIAL  Pharmacy Consult for Unasyn Indication: aspiration  Allergies  Allergen Reactions  . Amlodipine Other (See Comments)    fatigue  . Hydrochlorothiazide Other (See Comments)    Hair loss  . Ibuprofen     Affects her kidneys  . Penicillins Other (See Comments)    Has patient had a PCN reaction causing immediate rash, facial/tongue/throat swelling, SOB or lightheadedness with hypotension: Unknown Has patient had a PCN reaction causing severe rash involving mucus membranes or skin necrosis: Unknown Has patient had a PCN reaction that required hospitalization: Unknown Has patient had a PCN reaction occurring within the last 10 years: Unknown If all of the above answers are "NO", then may proceed with Cephalosporin use.     Patient Measurements: Height: 5\' 4"  (162.6 cm) Weight: 106 lb 14.8 oz (48.5 kg) IBW/kg (Calculated) : 54.7 Adjusted Body Weight:    Vital Signs: Temp: 98.3 F (36.8 C) (07/10 1215) Temp Source: Axillary (07/10 1215) BP: 145/65 (07/10 1801) Pulse Rate: 88 (07/10 1351) Intake/Output from previous day: 07/09 0701 - 07/10 0700 In: 546.3 [I.V.:332.8; IV Piggyback:213.5] Out: -  Intake/Output from this shift: No intake/output data recorded.  Labs: Recent Labs    2018/05/28 1729 2018/05/28 1814 06/30/18 0338 07/01/18 0528  WBC  --  4.8  --   --   HGB 16.0* 14.6  --   --   PLT  --  272  --   --   CREATININE 1.40* 1.42* 1.27* 1.18*   Estimated Creatinine Clearance: 25.7 mL/min (A) (by C-G formula based on SCr of 1.18 mg/dL (H)). No results for input(s): VANCOTROUGH, VANCOPEAK, VANCORANDOM, GENTTROUGH, GENTPEAK, GENTRANDOM, TOBRATROUGH, TOBRAPEAK, TOBRARND, AMIKACINPEAK, AMIKACINTROU, AMIKACIN in the last 72 hours.   Microbiology: No results found for this or any previous visit (from the past 720 hour(s)).  Medical History: Past Medical History:  Diagnosis Date  . Hypertension   . Kidney stones   . Stroke [facility](HCC)     Assessment: CC/HPI: Recent SAH ([facility]) presents with R hemiparesis, r/o CVA  PMH: HTN, TIA July 2019, SAH, HTN, HLD, CKD3, dysarthria  ID: Abx for aspiration. Afebrile. WBC 4.8. Scr 1.18. CrCl 25.  Unasyn 7/10>>   Goal of Therapy:  Eradication of infection   Plan:  Unasyn 1.5g IV q 12 hrs. F/u for any allergic rxn with unspecified PCN allergy.  Luba Matzen S. Merilynn Finlandobertson, PharmD, BCPS Clinical Staff Pharmacist Pager [phone]  Misty Stanleyobertson, Rishika Mccollom Stillinger 07/01/2018,7:15 PM

## 2018-07-02 ENCOUNTER — Inpatient Hospital Stay (HOSPITAL_COMMUNITY): Payer: Medicare Other

## 2018-07-02 DIAGNOSIS — Z7982 Long term (current) use of aspirin: Secondary | ICD-10-CM

## 2018-07-02 DIAGNOSIS — Z7189 Other specified counseling: Secondary | ICD-10-CM

## 2018-07-02 DIAGNOSIS — M7989 Other specified soft tissue disorders: Secondary | ICD-10-CM

## 2018-07-02 DIAGNOSIS — G8191 Hemiplegia, unspecified affecting right dominant side: Secondary | ICD-10-CM

## 2018-07-02 DIAGNOSIS — Z515 Encounter for palliative care: Secondary | ICD-10-CM

## 2018-07-02 DIAGNOSIS — M79609 Pain in unspecified limb: Secondary | ICD-10-CM

## 2018-07-02 LAB — CBC
HCT: 40.4 % (ref 36.0–46.0)
Hemoglobin: 13.5 g/dL (ref 12.0–15.0)
MCH: 28.5 pg (ref 26.0–34.0)
MCHC: 33.4 g/dL (ref 30.0–36.0)
MCV: 85.2 fL (ref 78.0–100.0)
Platelets: 261 10*3/uL (ref 150–400)
RBC: 4.74 MIL/uL (ref 3.87–5.11)
RDW: 13 % (ref 11.5–15.5)
WBC: 6.4 10*3/uL (ref 4.0–10.5)

## 2018-07-02 LAB — BASIC METABOLIC PANEL
Anion gap: 12 (ref 5–15)
BUN: 22 mg/dL (ref 8–23)
CALCIUM: 8.8 mg/dL — AB (ref 8.9–10.3)
CO2: 25 mmol/L (ref 22–32)
Chloride: 98 mmol/L (ref 98–111)
Creatinine, Ser: 1.42 mg/dL — ABNORMAL HIGH (ref 0.44–1.00)
GFR calc Af Amer: 37 mL/min — ABNORMAL LOW (ref >60)
GFR calc non Af Amer: 32 mL/min — ABNORMAL LOW (ref >60)
GLUCOSE: 160 mg/dL — AB (ref 70–99)
POTASSIUM: 3.2 mmol/L — AB (ref 3.5–5.1)
Sodium: 135 mmol/L (ref 135–145)

## 2018-07-02 LAB — MAGNESIUM
Magnesium: 2 mg/dL (ref 1.7–2.4)
Magnesium: 2 mg/dL (ref 1.7–2.4)

## 2018-07-02 LAB — PHOSPHORUS
PHOSPHORUS: 1.8 mg/dL — AB (ref 2.5–4.6)
Phosphorus: 2.3 mg/dL — ABNORMAL LOW (ref 2.5–4.6)

## 2018-07-02 MED ORDER — CARVEDILOL 12.5 MG PO TABS
12.5000 mg | ORAL_TABLET | Freq: Two times a day (BID) | ORAL | Status: DC
  Administered 2018-07-02 – 2018-07-03 (×2): 12.5 mg via ORAL
  Filled 2018-07-02 (×2): qty 1

## 2018-07-02 MED ORDER — ASPIRIN 325 MG PO TABS
325.0000 mg | ORAL_TABLET | Freq: Every day | ORAL | Status: DC
  Administered 2018-07-02 – 2018-07-04 (×3): 325 mg via ORAL
  Filled 2018-07-02 (×3): qty 1

## 2018-07-02 MED ORDER — POTASSIUM & SODIUM PHOSPHATES 280-160-250 MG PO PACK
2.0000 | PACK | ORAL | Status: AC
  Administered 2018-07-02 – 2018-07-03 (×4): 2 via ORAL
  Filled 2018-07-02 (×4): qty 2

## 2018-07-02 MED ORDER — POTASSIUM CHLORIDE 20 MEQ/15ML (10%) PO SOLN
40.0000 meq | Freq: Two times a day (BID) | ORAL | Status: AC
  Administered 2018-07-03 (×2): 40 meq via ORAL
  Filled 2018-07-02 (×2): qty 30

## 2018-07-02 NOTE — Care Management Note (Signed)
Case Management Note  Patient Details  Name: Gina Boone MRN: [id number] Date of Birth: [birth year]  Subjective/Objective:       Pt admitted with CVA. She is from home with family.             Action/Plan: Recommendations are for CIR. CM following for d/c disposition.   Expected Discharge Date:                  Expected Discharge Plan:  IP Rehab Facility  In-House Referral:     Discharge planning Services  CM Consult  Post Acute Care Choice:    Choice offered to:     DME Arranged:    DME Agency:     HH Arranged:    HH Agency:     Status of Service:  In process, will continue to follow  If discussed at Long Length of Stay Meetings, dates discussed:    Additional Comments:  Kermit BaloKelli F Zarrah Loveland, RN 07/02/2018, 11:47 AM

## 2018-07-02 NOTE — Progress Notes (Signed)
SLP Cancellation Note  Patient Details Name: Clabe Sealeggy Wattley MRN: [id number] DOB: 28-Apr-1929   Cancelled treatment:       Reason Eval/Treat Not Completed: Medical issues which prohibited therapy. Will follow for respiratory stability to participate in dysphagia therapy.    Neomia Herbel, Riley NearingBonnie [name] 07/02/2018, 8:25 AM

## 2018-07-02 NOTE — Progress Notes (Signed)
Subjective: Gina Boone had several episodes of hypoxia last night that required 15L non-rebreather and respiratory was able to wean her this am to 8L [place]. She continues to nod her head yes or no, but is unable to speak legibly. She is not having any pain except for tenderness in right leg with plantar flexion and feels that her shortness of breath has improved somewhat from last night.    Objective:  Vital signs in last 24 hours: Vitals:   07/02/18 0721 07/02/18 0737 07/02/18 0842 07/02/18 0900  BP:   (!) 181/61   Pulse: 87 84 84   Resp: (!) 23 17 16    Temp:   97.8 F (36.6 C)   TempSrc:   Oral   SpO2: 96% 94% 94%   Weight:    116 lb 13.5 oz (53 kg)  Height:        Constitution: frail, lying supine in bed, NAD, significant dysarthria HENT: non-rebreather in place, normocephalic, atraumatic Eyes: EOM intact, no scleral icterus Cardio: regular rate & rhythm, no murmurs Respiratory: on 15L non-rebreather, increased respiratory effort, crackles, rhonci in bilateral lower lobes MSK: strength 1/5 RUE, 1/5 RLE; strength at baseline on left 5/5 but noticeably weak Right leg swelling, tenderness on plantarflexion, no erythema or palpable chords Neuro: Alert, dysarthric, no loss of sensation UE &LE bilaterally CN II-IV, VI, VIII, intact. CN V right facial droop CN IX difficulty swallowing CN XI 4/5 right rotation of head and right shoulder shrug; 5/5 on left CN XII +tonge movement but appears limited Skin: clean, dry and intact   Assessment/Plan:  Active Problems:   Cerebral thrombosis with cerebral infarction   Left pontine stroke (HCC)   Aspiration into airway   Dysarthria   Essential hypertension   Pure hypercholesterolemia   Malnutrition of moderate degree   Aspiration pneumonia of left lower lobe [facility](HCC)  [name] with recent history of probable SAH 6/23, TIA one week ago who presented 7/9 with right hemiparesis in the UE &LE &right facial droop.    #CVA:1/5 RUE & LE today. She continues to have right sided facial droop and is severe aspiration risk. Failed barium swallow study 7/9. [facility], MRI showed left vertebral artery occlusion and left pontine infarct. Echo showed LVEF 55-60%.   - Neurology on board  - asa 325 mg po - cor-trak in place, receiving tube feed - D5 .45% 16300mL/hr - approved for CIR, transfer onced stabilized and breathing improved  #Uncontrolled HTN: restarted home BP medications 7/10 after 48 hour permissive HTN. Blood pressure has continued to be high and difficult to bring down.   - nifedipine 60mg  qd - coreg increased to 12.5mg  bid (home dose 6.25mg  bid) - labetalol IV bp systolic >200 or diastolic > 120  #Acute hypoxemia secondary to aspiration: CXR 7/9 demonstrates barium aspirated into the left lung, increased aspiration yesterday 7/10, CXR showed further infiltrate of left lower lobe and right lower lobe. She is at considerable risk for aspiration pneumonia and unasyn was started yesterday after patient became hypoxic. She required 15L rebreather overnight and was weaned to 8L [place] this am, sats around 90%  - RT consulted - 8L [place], flutter valve when appropriate - Cont. unasyn   #Right leg swelling: chronic swelling per pt. Calf is tender to palpation. Pt is high risk for DVT but anti-coagulation previously has been held due to possible previous SAH.   - SCDs in place  - doppler US pending  -  consider anti-coagulation with lovenox    #CKD stage ZOX:WRUEAVW of CKD. Cr 1.42 today (1.18 yesterday) - d/c furosemide - D5 .45% 12mL/hr   #Hypokalemic:3.2 (3.3 yesterday), Magnesium 2.0 - d/c furosemide - potassium & sodium phosphate 280-160-250mg  2 packs q4h - D5 .45% 181mL/hr  #Hypophosphatemia: phosphate 1.8  - potassium & sodium phosphate 280-160-250mg  2 packs q4h  #Hyperlipidemia: lipid panel cholest 248, LDL 171 - holding niacin   Dispo: Anticipated  discharge in approximately 2 days. She has been accepted to CIR.    Guinevere Scarlet A, DO 07/02/2018, 9:56 AM Pager: [phone]

## 2018-07-02 NOTE — Progress Notes (Signed)
*  Preliminary Results* Right lower extremity venous duplex completed. Right lower extremity is negative for deep vein thrombosis. There is no evidence of right Baker's cyst.  07/02/2018 2:50 PM  Blanch MediaMegan Dontrae Morini

## 2018-07-02 NOTE — Progress Notes (Signed)
Physical Therapy Treatment Patient Details Name: Gina Boone MRN: [id number] DOB: 05-07-1929 Today's Date: 07/02/2018    History of Present Illness 84 y.o. female with a history of recent subarachnoid hemorrhage who presents with Right-sided weakness.  Found to have infarct in left paramedian pons    PT Comments    Ms. Goldston with visitors in room. Agreeable to work with PT today. Noted to have respiratory issues overnight with nursing allowing PT to treat today. On 10L O2 during session with O2 sat 85-94% with mobility. Patient requiring mod/max A +2 for bed mobility to sit EOB with Mod A for upright balance for ~4 min. Did not progress mobility further for patient safety. PT to continue to follow.    Follow Up Recommendations  CIR     Equipment Recommendations  (TBD)    Recommendations for Other Services Rehab consult     Precautions / Restrictions Precautions Precautions: Fall Restrictions Weight Bearing Restrictions: No    Mobility  Bed Mobility Overal bed mobility: Needs Assistance Bed Mobility: Supine to Sit;Sit to Supine     Supine to sit: Mod assist;Max assist;+2 for physical assistance Sit to supine: Mod assist;Max assist;+2 for physical assistance   General bed mobility comments: patient attempting to move L LE towards EOB, but unable to complete without physical assist from PT  Transfers                 General transfer comment: deferred due to reduced O2 sats  Ambulation/Gait                 Stairs             Wheelchair Mobility    Modified Rankin (Stroke Patients Only) Modified Rankin (Stroke Patients Only) Pre-Morbid Rankin Score: No symptoms Modified Rankin: Severe disability     Balance Overall balance assessment: Needs assistance Sitting-balance support: Feet supported;Bilateral upper extremity supported Sitting balance-Leahy Scale: Poor   Postural control: Posterior lean                                  Cognition Arousal/Alertness: Awake/alert Behavior During Therapy: Flat affect Overall Cognitive Status: Difficult to assess                                        Exercises      General Comments        Pertinent Vitals/Pain Pain Assessment: Faces Faces Pain Scale: Hurts a little bit Pain Location: generalized  Pain Descriptors / Indicators: Grimacing;Guarding Pain Intervention(s): Limited activity within patient's tolerance;Monitored during session;Repositioned    Home Living                      Prior Function            PT Goals (current goals can now be found in the care plan section) Acute Rehab PT Goals Patient Stated Goal: "We will have to see"  Pt somewhat overwhelmed with diagnosis and deficits and demonstrated difficulty formulating goal  PT Goal Formulation: With patient/family Time For Goal Achievement: 07/14/18 Potential to Achieve Goals: Fair Progress towards PT goals: Progressing toward goals    Frequency    Min 3X/week      PT Plan Current plan remains appropriate    Co-evaluation  AM-PAC PT "6 Clicks" Daily Activity  Outcome Measure  Difficulty turning over in bed (including adjusting bedclothes, sheets and blankets)?: Unable Difficulty moving from lying on back to sitting on the side of the bed? : Unable Difficulty sitting down on and standing up from a chair with arms (e.g., wheelchair, bedside commode, etc,.)?: Unable Help needed moving to and from a bed to chair (including a wheelchair)?: Total Help needed walking in hospital room?: Total Help needed climbing 3-5 steps with a railing? : Total 6 Click Score: 6    End of Session   Activity Tolerance: Patient tolerated treatment well;Patient limited by fatigue Patient left: in bed;with call bell/phone within reach;with bed alarm set;with family/visitor present Nurse Communication: Mobility status PT Visit Diagnosis: Muscle weakness  (generalized) (M62.81);Other symptoms and signs involving the nervous system (R29.898)     Time: [id number] PT Time Calculation (min) (ACUTE ONLY): 12 min  Charges:  $Therapeutic Activity: 8-22 mins                    G Codes:       Kipp LaurenceStephanie R Aaron, PT, DPT 07/02/18 2:07 PM Pager: [phone]

## 2018-07-02 NOTE — Progress Notes (Signed)
Pt unable to tolerate CPT this shift due to high requirements of oxygen.  Attempted to work with patient with flutter valve but patient too weak to effectively use flutter valve.  RN aware.  Family at bedside.

## 2018-07-02 NOTE — Progress Notes (Signed)
Inpatient Rehabilitation Admissions Coordinator  Noted respiratory issues overnight. I will follow her progress as she will need to demonstrated the ability to tolerate more intense therapy.  Ottie GlazierBarbara Charda Janis, RN, MSN Rehab Admissions Coordinator [phone] 07/02/2018 1:15 PM

## 2018-07-02 NOTE — Consult Note (Signed)
Consultation Note Date: 07/02/2018   Patient Name: Gina Boone  DOB: 12-05-1929  MRN: [id number]  Age / Sex: 84 y.o., female  PCP: Patient, No Pcp Per Referring Physician: Oval Linsey, MD  Reason for Consultation: Establishing goals of care  HPI/Patient Profile: 84 y.o. female  with past medical history of TIA's, HTN, hearing loss, hyperlipidemia, SAH 6/23 (small volume over frontal lobe, admitted to [facility]- no deficits noted- she was discharged home on 6/29 with home PT/OT 3x per week), seen [facility] at [facility] ED on 7/5 for likely TIA, admitted on 06/28/2018 with R sided weakness and speech change after having R sided eye pain for several days. Workup reveals left pontine infarct with no associated hemorrhage, and occluded left vertebral artery. SLP eval shows severe dysphagia with recommendations for NPO status. Cor-trak placed on 7/11- noted to still be at high risk of aspirating secretions (per SLP note- "severity of dysphagia is impacting airway protection"). Became hypoxic on the evening on 7/11 requiring non-rebreather. Chest xray shows cardiomegaly with mild pulmonary edema; RLL airspace disease- atelectasis vs pneumonia clinically favoring aspiration pneumonia. Palliative medicine consulted for [place].   Clinical Assessment and Goals of Care: Patient was in bed resting comfortable. Patient's daughter Katharine Look was at bedside.   I have reviewed medical records including EPIC notes, labs and imaging, assessed the patient and then met at the bedside along with patient's daughter- Katharine Look  to discuss diagnosis prognosis, [place], EOL wishes, disposition and options.  Upon my introduction Katharine Look questioned as to the reason for meeting with PMT and seemed hesitant to meet.  I introduced Palliative Medicine as specialized medical care for people living with serious illness. It focuses on providing relief from the  symptoms and stress of a serious illness. The goal is to improve quality of life for both the patient and the family.  Katharine Look stated that her mother was stable since episode of hypoxia last evening. Discussed with her that may be a good time to discuss "what ifs" while patient is stable, should patient decline in the future or have further episodes of hypoxia. Also discussed potential for future problems with dysphagia.   Katharine Look agreed to meet further with PMT tomorrow when her sister- Loletha Carrow could also be present.    Hard Choices booklet left for review. The family was encouraged to call with questions or concerns.   Primary Decision Maker NEXT OF KIN- patient's children    SUMMARY OF RECOMMENDATIONS -Continue current level of care -PMT to meet with Katharine Look and Loletha Carrow tomorrow at 11am for further discussion of [place]    Code Status/Advance Care Planning:  Full code  Additional Recommendations (Limitations, Scope, Preferences):  Full Scope Treatment  Prognosis:    Unable to determine  Discharge Planning: To Be Determined  Primary Diagnoses: Present on Admission: **None**   I have reviewed the medical record, interviewed the patient and family, and examined the patient. The following aspects are pertinent.  Past Medical History:  Diagnosis Date  . Hypertension   . Kidney stones   .  Stroke [facility])    Social History   Socioeconomic History  . Marital status: Widowed    Spouse name: Not on file  . Number of children: Not on file  . Years of education: Not on file  . Highest education level: Not on file  Occupational History  . Not on file  Social Needs  . Financial resource strain: Not on file  . Food insecurity:    Worry: Not on file    Inability: Not on file  . Transportation needs:    Medical: Not on file    Non-medical: Not on file  Tobacco Use  . Smoking status: Never Smoker  . Smokeless tobacco: Never Used  Substance and Sexual Activity  . Alcohol use:  Never    Frequency: Never  . Drug use: Never  . Sexual activity: Not on file  Lifestyle  . Physical activity:    Days per week: Not on file    Minutes per session: Not on file  . Stress: Not on file  Relationships  . Social connections:    Talks on phone: Not on file    Gets together: Not on file    Attends religious service: Not on file    Active member of club or organization: Not on file    Attends meetings of clubs or organizations: Not on file    Relationship status: Not on file  Other Topics Concern  . Not on file  Social History Narrative  . Not on file   Family History  Problem Relation Age of Onset  . Heart disease Mother   . High blood pressure Brother    Scheduled Meds: . aspirin  325 mg Oral Daily  . carvedilol  12.5 mg Oral BID WC  . cholecalciferol  5,000 Units Oral Daily  . NIFEdipine  60 mg Oral Daily  . potassium & sodium phosphates  2 packet Oral Q4H  . topiramate  25 mg Oral BID  . cyanocobalamin  100 mcg Oral Daily   Continuous Infusions: . ampicillin-sulbactam (UNASYN) IV 1.5 g (07/02/18 0830)  . dextrose 5 % and 0.45% NaCl 100 mL/hr (07/02/18 1038)  . feeding supplement (JEVITY 1.2 CAL) 1,000 mL (07/01/18 1752)   PRN Meds:.labetalol Medications Prior to Admission:  Prior to Admission medications   Medication Sig Start Date End Date Taking? Authorizing Provider  Biotin 2.5 MG TABS Take 2.5 mg by mouth daily.   Yes [provider]  carvedilol (COREG) 6.25 MG tablet Take 6.25 mg by mouth 2 (two) times daily with a meal.   Yes [provider]  cholecalciferol (VITAMIN D) 1000 units tablet Take 5,000 Units by mouth daily.   Yes [provider]  cyanocobalamin 100 MCG tablet Take 100 mcg by mouth daily.   Yes [provider]  furosemide (LASIX) 40 MG tablet Take 60 mg by mouth daily. 04/27/18  Yes [provider]  niacin 500 MG tablet Take 500 mg by mouth at bedtime.   Yes [provider]    NIFEdipine (PROCARDIA-XL/ADALAT CC) 60 MG 24 hr tablet Take 60 mg by mouth daily. 04/27/18  Yes [provider]   Allergies  Allergen Reactions  . Amlodipine Other (See Comments)    fatigue  . Hydrochlorothiazide Other (See Comments)    Hair loss  . Ibuprofen     Affects her kidneys  . Penicillins Other (See Comments)    Has patient had a PCN reaction causing immediate rash, facial/tongue/throat swelling, SOB or lightheadedness with  hypotension: Unknown Has patient had a PCN reaction causing severe rash involving mucus membranes or skin necrosis: Unknown Has patient had a PCN reaction that required hospitalization: Unknown Has patient had a PCN reaction occurring within the last 10 years: Unknown If all of the above answers are "NO", then may proceed with Cephalosporin use.    Review of Systems  Unable to perform ROS: Acuity of condition    Physical Exam  Vital Signs: BP (!) 181/61 (BP Location: Right Arm)   Pulse 84   Temp 97.8 F (36.6 C) (Oral)   Resp 16   Ht 5' 4"  (1.626 m)   Wt 53 kg (116 lb 13.5 oz)   SpO2 94%   BMI 20.06 kg/m  Pain Scale: Faces   Pain Score: 0-No pain   SpO2: SpO2: 94 % O2 Device:SpO2: 94 % O2 Flow Rate: .O2 Flow Rate (L/min): 9 L/min  IO: Intake/output summary:   Intake/Output Summary (Last 24 hours) at 07/02/2018 1400 Last data filed at 07/02/2018 0300 Gross per 24 hour  Intake 1100 ml  Output -  Net 1100 ml    LBM: Last BM Date: 07/02/18 Baseline Weight: Weight: 52.1 kg (114 lb 13.8 oz) Most recent weight: Weight: 53 kg (116 lb 13.5 oz)     Palliative Assessment/Data: PPS: 10%     Thank you for this consult. Palliative medicine will continue to follow and assist as needed.   Time In: 1330 Time Out: 1440 Time Total: 70 mins Greater than 50%  of this time was spent counseling and coordinating care related to the above assessment and plan.  Signed by: Mariana Kaufman, AGNP-C Palliative Medicine    Please contact  Palliative Medicine Team phone at [phone] for questions and concerns.  For individual provider: See Shea Evans

## 2018-07-02 NOTE — Progress Notes (Signed)
Patient is unable to tolerate the CPT and cannot do the flutter valve.

## 2018-07-02 NOTE — Progress Notes (Signed)
Medicine attending: I examined this patient today together with resident physician Dr. Darnelle CatalanJamie Seawell and I concur with her evaluation and management plan which we discussed together.  84 year old woman with poorly controlled hypertension.  She has a history of recent TIAs.  She was on aspirin which was held when there was a suspicion of a punctate area of subarachnoid hemorrhage.  She presented on July 8 with a completed left pontine stroke resulting in right hemiplegia and swallowing dysfunction.  She developed acute hypoxic respiratory distress.  Chest x-ray July 10 shows likely new left and right lower lobe infiltrates consistent with aspiration pneumonia.  She was started on Unasyn.  She required nonrebreather oxygen therapy overnight but per our discussion with respiratory therapy who is in the room at the time of morning rounds, respiratory status appears to be stabilizing and we will attempt to wean her oxygen down.  Current oxygen saturation 94% on 100% FiO2 with oxygen flow rate 9 L/min.  She appears comfortable at rest.  Lungs with coarse rhonchi one third the way up over the right hemithorax.   Rales at the left base We are allowing for permissive hypertension.  Most recent blood pressure recorded 181/61. Persistent right hemiplegia.  Minimal ability to grip my hand today which per Dr. Cleaster CorinSeawell is actually an improvement compared with yesterday.  Not stable enough yet for inpatient rehab. Continue current management plan.

## 2018-07-03 DIAGNOSIS — E878 Other disorders of electrolyte and fluid balance, not elsewhere classified: Secondary | ICD-10-CM

## 2018-07-03 DIAGNOSIS — R197 Diarrhea, unspecified: Secondary | ICD-10-CM

## 2018-07-03 DIAGNOSIS — R0902 Hypoxemia: Secondary | ICD-10-CM

## 2018-07-03 LAB — BASIC METABOLIC PANEL
Anion gap: 13 (ref 5–15)
BUN: 27 mg/dL — ABNORMAL HIGH (ref 8–23)
CHLORIDE: 98 mmol/L (ref 98–111)
CO2: 25 mmol/L (ref 22–32)
CREATININE: 1.24 mg/dL — AB (ref 0.44–1.00)
Calcium: 9 mg/dL (ref 8.9–10.3)
GFR calc non Af Amer: 38 mL/min — ABNORMAL LOW (ref >60)
GFR, EST AFRICAN AMERICAN: 44 mL/min — AB (ref >60)
Glucose, Bld: 131 mg/dL — ABNORMAL HIGH (ref 70–99)
POTASSIUM: 3.1 mmol/L — AB (ref 3.5–5.1)
Sodium: 136 mmol/L (ref 135–145)

## 2018-07-03 LAB — GLUCOSE, CAPILLARY
GLUCOSE-CAPILLARY: 128 mg/dL — AB (ref 70–99)
Glucose-Capillary: 105 mg/dL — ABNORMAL HIGH (ref 70–99)
Glucose-Capillary: 114 mg/dL — ABNORMAL HIGH (ref 70–99)
Glucose-Capillary: 92 mg/dL (ref 70–99)
Glucose-Capillary: 92 mg/dL (ref 70–99)

## 2018-07-03 LAB — MAGNESIUM: Magnesium: 2 mg/dL (ref 1.7–2.4)

## 2018-07-03 LAB — PHOSPHORUS: Phosphorus: 2.1 mg/dL — ABNORMAL LOW (ref 2.5–4.6)

## 2018-07-03 MED ORDER — POTASSIUM & SODIUM PHOSPHATES 280-160-250 MG PO PACK
2.0000 | PACK | ORAL | Status: AC
  Administered 2018-07-03 (×4): 2 via ORAL
  Filled 2018-07-03 (×4): qty 2

## 2018-07-03 MED ORDER — ACETAMINOPHEN 325 MG PO TABS
650.0000 mg | ORAL_TABLET | Freq: Four times a day (QID) | ORAL | Status: DC | PRN
  Administered 2018-07-04 – 2018-07-05 (×3): 650 mg via NASOGASTRIC
  Filled 2018-07-03 (×3): qty 2

## 2018-07-03 MED ORDER — CLONIDINE HCL 0.1 MG/24HR TD PTWK
0.1000 mg | MEDICATED_PATCH | TRANSDERMAL | Status: DC
  Administered 2018-07-03: 0.1 mg via TRANSDERMAL
  Filled 2018-07-03: qty 1

## 2018-07-03 MED ORDER — SODIUM CHLORIDE 0.9 % IV SOLN: INTRAVENOUS | Status: DC

## 2018-07-03 MED ORDER — CARVEDILOL 12.5 MG PO TABS: 25.0000 mg | ORAL_TABLET | Freq: Two times a day (BID) | ORAL | Status: DC

## 2018-07-03 NOTE — Progress Notes (Signed)
Daily Progress Note   Patient Name: Gina Boone       Date: 07/03/2018 DOB: 1929/11/12  Age: 84 y.o. MRN#: [id number] Attending Physician: Doneen PoissonKlima, Lawrence, MD Primary Care Physician: Patient, No Pcp Per Admit Date: 07/11/2018  Reason for Consultation/Follow-up: Establishing goals of care  Subjective: Limited GOC meeting with patient's two daughters- Dois DavenportSandra and Vickie this morning. Patient very lethargic. Continues on 10L O2.  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 She enjoys gardening and volunteering. Being independent is very important to her.   Dois DavenportSandra and Vickie have hopes that their mother can return to her prior state of function with rehab. We discussed the outcome of swallow eval. They are hopeful for continued speech therapy. They are uncertain about their mother's preference for permanent feeding tube.  We discussed patient's views on long term life sustaining measures- she would not want long term ventilation if there was not a chance for a return to her quality of life.   Dois DavenportSandra stated that she did not feel ready to have further discussion with Palliative Medicine yet as she had not yet spoken with any physicians about patient's overall prognosis for recovery.   Review of Systems  Unable to perform ROS: Acuity of condition    Length of Stay: 4  Current Medications: Scheduled Meds:  . aspirin  325 mg Oral Daily  . cholecalciferol  5,000 Units Oral Daily  . NIFEdipine  60 mg Oral Daily  . potassium & sodium phosphates  2 packet Oral Q4H  . topiramate  25 mg Oral BID  . cyanocobalamin  100 mcg Oral Daily    Continuous Infusions: . ampicillin-sulbactam (UNASYN) IV 1.5 g (07/03/18 0840)  . feeding supplement (JEVITY 1.2 CAL) 1,000 mL (07/01/18 1752)    PRN  Meds: labetalol  Physical Exam  Constitutional:  frail  Neurological:  Lethargic, purposeful movement of left hand, R side flaccid  Nursing note and vitals reviewed.           Vital Signs: BP (!) 149/58   Pulse 79   Temp 97.6 F (36.4 C) (Axillary)   Resp 18   Ht 5\' 4"  (1.626 m)   Wt 55 kg (121 lb 4.1 oz)   SpO2 93%   BMI 20.81 kg/m  SpO2: SpO2: 93 % O2 Device: O2 Device: Nasal  Cannula O2 Flow Rate: O2 Flow Rate (L/min): 10 L/min  Intake/output summary:   Intake/Output Summary (Last 24 hours) at 07/03/2018 1145 Last data filed at 07/03/2018 0400 Gross per 24 hour  Intake 2437 ml  Output -  Net 2437 ml   LBM: Last BM Date: 06/02/18 Baseline Weight: Weight: 52.1 kg (114 lb 13.8 oz) Most recent weight: Weight: 55 kg (121 lb 4.1 oz)       Palliative Assessment/Data: PPS: 10%      Patient Active Problem List   Diagnosis Date Noted  . Advanced care planning/counseling discussion   . Palliative care by specialist   . Goals of care, counseling/discussion   . Malnutrition of moderate degree 07/01/2018  . Aspiration pneumonia of left lower lobe (HCC)   . Cerebral thrombosis with cerebral infarction 06/30/2018  . Left pontine stroke (HCC)   . Aspiration into airway   . Dysarthria   . Essential hypertension   . Pure hypercholesterolemia     Palliative Care Assessment & Plan   Patient Profile: 84 y.o. female  with past medical history of TIA's, HTN, hearing loss, hyperlipidemia, SAH 6/23 (small volume over frontal lobe, admitted to [facility]- no deficits noted- she was discharged home on 6/29 with home PT/OT 3x per week), seen again at [facility] ED on 7/5 for likely TIA, admitted on 07/18/2018 with R sided weakness and speech change after having R sided eye pain for several days. Workup reveals left pontine infarct with no associated hemorrhage, and occluded left vertebral artery. SLP eval shows severe dysphagia with recommendations for NPO status. Cor-trak placed on  7/11- noted to still be at high risk of aspirating secretions (per SLP note- "severity of dysphagia is impacting airway protection"). Became hypoxic on the evening on 7/11 requiring non-rebreather. Chest xray shows cardiomegaly with mild pulmonary edema; RLL airspace disease- atelectasis vs pneumonia clinically favoring aspiration pneumonia. Palliative medicine consulted for GOC.   Assessment/Recommendations/Plan   Continue current level of care  Family would like discussion with Primary Care team regarding patient's overall status and prognosis  PMT will shadow for now, please call if family would like further discussion with Palliative team  Goals of Care and Additional Recommendations:  Limitations on Scope of Treatment: Full Scope Treatment  Code Status:  Full code  Prognosis:   Unable to determine  Discharge Planning:  To Be Determined  Care plan was discussed with patient's daughters.  Thank you for allowing the Palliative Medicine Team to assist in the care of this patient.   Time In: 1100 Time Out: 1150 Total Time 50 mins Prolonged Time Billed Yes      Greater than 50%  of this time was spent counseling and coordinating care related to the above assessment and plan.  Ocie Bob, AGNP-C Palliative Medicine   Please contact Palliative Medicine Team phone at [phone] for questions and concerns.

## 2018-07-03 NOTE — Progress Notes (Signed)
  Speech Language Pathology Treatment: Dysphagia  Patient Details Name: Gina Boone MRN: [id number] DOB: August 13, 1929 Today's Date: 07/03/2018 Time: [id number] SLP Time Calculation (min) (ACUTE ONLY): 37 min  Assessment / Plan / Recommendation Clinical Impression  Pt more alert today, able to participate in session, but still profoundly weak and medically unstable. Aggressive oral care provided, large mucous plug removed from pts pharynx. Pt does not attempt to mobilize secretions independently. Cues for coughing elicits response today, which is an improvement, but even with best effort, pt is unable to expectorate secretions. During attempts, pts HR dropped to mid 20s, pt unable to talk, pale and RNs arrived. HR returned to 70s. Pt then able to follow commands for lip seal on RMT/EMST (Respironics Device) with 20 successful reps at lowest pressure setting.   Provided explanation of pts dysphagia, visual feedback with MBS to pts family. Described factors contributing to poor prognosis for short term recovery of swallow function (generalized weakness, poor therapy tolerance, weak cough mechanism, advanced age, severity of dysphagia) and complication of probable aspiration of secretions over the long term.   For now, pt to continue NPO status, could initiate use of RMT with family if cleared by MD. Concerned for further brady events.   HPI        SLP Plan  Continue with current plan of care       Recommendations  Diet recommendations: NPO                Oral Care Recommendations: Oral care QID Follow up Recommendations: Skilled Nursing facility SLP Visit Diagnosis: Dysphagia, pharyngeal phase (R13.13) Plan: Continue with current plan of care       GO               [facility]remontBonnie Ronith Berti, MA CCC-SLP [phone]  Claudine MoutonDeBlois, Artha Chiasson [name] 07/03/2018, 2:16 PM

## 2018-07-03 NOTE — Progress Notes (Signed)
Internal Medicine Attending  Date: 07/03/2018  Patient name: Gina Boone Medical record number: [id number] Date of birth: [birth year] Age: 84 y.o. Gender: female  I saw and evaluated the patient. I reviewed the resident's note by Dr. Cleaster CorinSeawell and I agree with the resident's findings and plans as documented in her progress note.  Gina Boone is recovering quite nicely from her left lower lobe aspiration pneumonia on the Unasyn with the pulmonary toilet. That said, her prognosis remains very poor given her likely inability to protect her airway from her own oral secretions. CIR is awaiting to see if she can tolerate more intensive rehabilitation at this point. We have been able to decrease her oxygen to 4 L and maintaining at 94%. I suspect we can further decrease the oxygen over the next 24-48 hours. Physical exam is notable for left lower lobe bronchial breath sounds. There are also associated inspiratory crackles. She does not quite have Cheyne-Stokes respirations although her pattern is similar albeit to a lesser degree. We had a long conversation with the family to update them on her current status. We will continue with current supportive care.

## 2018-07-03 NOTE — Progress Notes (Signed)
OT Cancellation Note  Patient Details Name: Gina Boone MRN: [id number] DOB: 1929-01-15   Cancelled Treatment:    Reason Eval/Treat Not Completed: Fatigue/lethargy limiting ability to participate;Other (comment). Pt sleeping heavily, many family members present and palliative staff in.  [name], [name] [facility]fordJeanette 07/03/2018, 11:35 AM

## 2018-07-03 NOTE — Progress Notes (Signed)
Subjective: Mrs. Margo AyeHall doing better today as she is on 4L [place] with sats at 94%. Her two daughters are at bedside, and we discussed Mrs. Gerling's stroke and answered questions. Mrs. Margo AyeHall is continuing to communicate through shaking and nodding, although she can be somewhat understood when she tries to speak.   Objective:  Vital signs in last 24 hours: Vitals:   07/03/18 0813 07/03/18 0900 07/03/18 1140 07/03/18 1700  BP:      Pulse:  84    Resp:  (!) 27    Temp: 97.6 F (36.4 C)  97.6 F (36.4 C) 97.9 F (36.6 C)  TempSrc: Axillary  Axillary Axillary  SpO2:  94%    Weight:      Height:       Constitution: frail, lying supine in bed, NAD, significant dysarthria HENT: Cor-trak in place, normocephalic, atraumatic Eyes: EOM intact, no scleral icterus, right lid droop  Cardio: regular rate & rhythm, no murmurs Respiratory: on 4L [place], increased respiratory effort, crackles, rhonci in bilateral lower lobes MSK: strength 0/5 RUE, 1/5 RLE; strength at baseline on left appears 4/5 compared to yesterday Right leg swelling, no erythema or palpable chords Neuro: Alert, dysarthric, no loss of sensation UE &LE bilaterally CN II-IV, VI, VIII, intact. CN V right facial droop CN IX difficulty swallowing CN XI 4/5 right rotation of head and right shoulder shrug; 5/5 on left CN XII+tonge movement but appears limited Skin: clean, dry and intact    Assessment/Plan:  Active Problems:   Cerebral thrombosis with cerebral infarction   Left pontine stroke (HCC)   Aspiration into airway   Dysarthria   Essential hypertension   Pure hypercholesterolemia   Malnutrition of moderate degree   Aspiration pneumonia of left lower lobe [facility](HCC)   Advanced care planning/counseling discussion   Palliative care by specialist   Goals of care, counseling/discussion  [name] with recent history of probable SAH 6/23, TIA one week ago who presented 7/9 with righthemiparesisin the UE &LE &right  facial droop.   #CVA:0/5 RUE & 1/5 RLE today. She continues to have right sided facial droop and is severe aspiration risk. Failed barium swallow study 7/9. [facility] CT [facility], MRI showed left vertebral artery occlusion and left pontine infarct. Echo showed LVEF 55-60%.  - asa 325 mg po -cor-trak in place, receiving tube feed - approved for CIR, transfer onced stabilized and breathing improved   #Uncontrolled HTN: restarted home BP medications 7/10 after 48 hour permissive HTN.  BP continued to be high and coreg was initially increased to 12.5mg  bid. Now dc'd due to transient brady.   - dc nifedipine 60mg  qd (cannot be given crushed) - dc'd coreg (home dose 6.25mg  bid) due to transient bradycardia  - transdermal clonidine patch .1mg  patch - labetalol IV bp systolic >200 or diastolic > 120  #Acute hypoxemia secondary to aspiration: CXR 7/9 demonstrates barium aspirated into the left lung, increased aspiration yesterday 7/10, CXR showed further infiltrate of left lower lobe and right lower lobe. She is at considerable risk for aspiration pneumonia and unasyn was started 7/10 after patient became hypoxic. She initially required 15L non-rebreather and is now on 4L [place] with sats at 94%  - RT consulted - 4L [place], flutter valve when appropriate - Cont. unasyn   #Right leg swelling: chronic swelling per pt. Calf is tender to palpation. Pt is high risk for DVT but anti-coagulation previously has been held due to possible previous SAH. Doppler  US was negative for DVT.  - SCDs in place, cont.   #CKD stage ZOX:WRUEAVW of CKD. Cr 1.42today (1.18yesterday) - d/c furosemide   #Electrolytes K, Mg, Phos): K3.1(3.3yesterday), Magnesium 2.0, Phosphate 2.1 (1.8 yest) -d/c furosemide - d/c fluids - potassium & sodium phosphate 280-160-250mg  2 packs q4h today and yesterday - potassium chloride   #Hyperlipidemia: lipid panel cholest 248, LDL 171 - holding  niacin  #Diarrhea: started today, likely secondary to receiving supplement, potassium phosphate, and potassium chloride. Unasyn started only two days ago so unlikley c. Diff.  - monitor - D5NS d/c'd   Dispo: Anticipated discharge in approximately 2 days.   Versie Starks, DO 07/03/2018, 6:57 PM Pager: [phone]

## 2018-07-04 ENCOUNTER — Inpatient Hospital Stay (HOSPITAL_COMMUNITY): Payer: Medicare Other

## 2018-07-04 DIAGNOSIS — J9601 Acute respiratory failure with hypoxia: Secondary | ICD-10-CM

## 2018-07-04 LAB — BASIC METABOLIC PANEL
ANION GAP: 11 (ref 5–15)
Anion gap: 12 (ref 5–15)
BUN: 26 mg/dL — AB (ref 8–23)
BUN: 25 mg/dL — AB (ref 8–23)
CALCIUM: 9.1 mg/dL (ref 8.9–10.3)
CALCIUM: 9.2 mg/dL (ref 8.9–10.3)
CO2: 23 mmol/L (ref 22–32)
CO2: 21 mmol/L — AB (ref 22–32)
CREATININE: 1.07 mg/dL — AB (ref 0.44–1.00)
CREATININE: 0.98 mg/dL (ref 0.44–1.00)
Chloride: 103 mmol/L (ref 98–111)
Chloride: 107 mmol/L (ref 98–111)
GFR calc Af Amer: 58 mL/min — ABNORMAL LOW (ref >60)
GFR calc non Af Amer: 45 mL/min — ABNORMAL LOW (ref >60)
GFR, EST AFRICAN AMERICAN: 53 mL/min — AB (ref >60)
GFR, EST NON AFRICAN AMERICAN: 50 mL/min — AB (ref >60)
GLUCOSE: 115 mg/dL — AB (ref 70–99)
Glucose, Bld: 103 mg/dL — ABNORMAL HIGH (ref 70–99)
Potassium: 3.1 mmol/L — ABNORMAL LOW (ref 3.5–5.1)
Potassium: 3.6 mmol/L (ref 3.5–5.1)
SODIUM: 138 mmol/L (ref 135–145)
Sodium: 139 mmol/L (ref 135–145)

## 2018-07-04 LAB — URINALYSIS, ROUTINE W REFLEX MICROSCOPIC
Bilirubin Urine: NEGATIVE
Glucose, UA: NEGATIVE mg/dL
Hgb urine dipstick: NEGATIVE
KETONES UR: NEGATIVE mg/dL
LEUKOCYTES UA: NEGATIVE
Nitrite: NEGATIVE
Protein, ur: 30 mg/dL — AB
Specific Gravity, Urine: 1.018 (ref 1.005–1.030)
pH: 8 (ref 5.0–8.0)

## 2018-07-04 LAB — HEPATIC FUNCTION PANEL
ALBUMIN: 1.9 g/dL — AB (ref 3.5–5.0)
ALT: 64 U/L — AB (ref 0–44)
AST: 70 U/L — AB (ref 15–41)
Alkaline Phosphatase: 151 U/L — ABNORMAL HIGH (ref 38–126)
Bilirubin, Direct: 0.2 mg/dL (ref 0.0–0.2)
Indirect Bilirubin: 0.4 mg/dL (ref 0.3–0.9)
TOTAL PROTEIN: 6.3 g/dL — AB (ref 6.5–8.1)
Total Bilirubin: 0.6 mg/dL (ref 0.3–1.2)

## 2018-07-04 LAB — CBC
HCT: 43.8 % (ref 36.0–46.0)
Hemoglobin: 14.3 g/dL (ref 12.0–15.0)
MCH: 27.7 pg (ref 26.0–34.0)
MCHC: 32.6 g/dL (ref 30.0–36.0)
MCV: 84.9 fL (ref 78.0–100.0)
PLATELETS: 372 10*3/uL (ref 150–400)
RBC: 5.16 MIL/uL — ABNORMAL HIGH (ref 3.87–5.11)
RDW: 13.5 % (ref 11.5–15.5)
WBC: 12.3 10*3/uL — ABNORMAL HIGH (ref 4.0–10.5)

## 2018-07-04 LAB — GLUCOSE, CAPILLARY
GLUCOSE-CAPILLARY: 114 mg/dL — AB (ref 70–99)
Glucose-Capillary: 102 mg/dL — ABNORMAL HIGH (ref 70–99)
Glucose-Capillary: 107 mg/dL — ABNORMAL HIGH (ref 70–99)
Glucose-Capillary: 100 mg/dL — ABNORMAL HIGH (ref 70–99)
Glucose-Capillary: 100 mg/dL — ABNORMAL HIGH (ref 70–99)
Glucose-Capillary: 114 mg/dL — ABNORMAL HIGH (ref 70–99)

## 2018-07-04 LAB — PHOSPHORUS: PHOSPHORUS: 3.6 mg/dL (ref 2.5–4.6)

## 2018-07-04 LAB — MAGNESIUM
MAGNESIUM: 2.2 mg/dL (ref 1.7–2.4)
Magnesium: 2.3 mg/dL (ref 1.7–2.4)

## 2018-07-04 LAB — PROCALCITONIN: Procalcitonin: 4.75 ng/mL

## 2018-07-04 MED ORDER — HYDRALAZINE HCL 20 MG/ML IJ SOLN
10.0000 mg | Freq: Four times a day (QID) | INTRAMUSCULAR | Status: DC | PRN
  Administered 2018-07-04: 10 mg via INTRAVENOUS
  Filled 2018-07-04: qty 1

## 2018-07-04 MED ORDER — HYDRALAZINE HCL 25 MG PO TABS
25.0000 mg | ORAL_TABLET | Freq: Three times a day (TID) | ORAL | Status: DC
  Administered 2018-07-04 – 2018-07-05 (×4): 25 mg via ORAL
  Filled 2018-07-04 (×4): qty 1

## 2018-07-04 MED ORDER — FUROSEMIDE 10 MG/ML IJ SOLN
20.0000 mg | Freq: Once | INTRAMUSCULAR | Status: AC
  Administered 2018-07-04: 20 mg via INTRAVENOUS
  Filled 2018-07-04: qty 2

## 2018-07-04 MED ORDER — HYDRALAZINE HCL 20 MG/ML IJ SOLN
10.0000 mg | Freq: Once | INTRAMUSCULAR | Status: AC
  Administered 2018-07-04: 10 mg via INTRAVENOUS

## 2018-07-04 MED ORDER — MORPHINE SULFATE (PF) 2 MG/ML IV SOLN
INTRAVENOUS | Status: AC
  Filled 2018-07-04: qty 1

## 2018-07-04 MED ORDER — MORPHINE SULFATE (PF) 2 MG/ML IV SOLN
1.0000 mg | Freq: Once | INTRAVENOUS | Status: AC
  Administered 2018-07-04: 1 mg via INTRAVENOUS

## 2018-07-04 MED ORDER — CLONIDINE HCL 0.2 MG/24HR TD PTWK: 0.2000 mg | MEDICATED_PATCH | TRANSDERMAL | Status: DC

## 2018-07-04 MED ORDER — LABETALOL HCL 5 MG/ML IV SOLN
5.0000 mg | Freq: Once | INTRAVENOUS | Status: AC
  Administered 2018-07-04: 5 mg via INTRAVENOUS
  Filled 2018-07-04: qty 4

## 2018-07-04 MED ORDER — SIMETHICONE 40 MG/0.6ML PO SUSP
80.0000 mg | Freq: Four times a day (QID) | ORAL | Status: DC | PRN
  Administered 2018-07-05: 80 mg via ORAL
  Filled 2018-07-04 (×3): qty 1.2

## 2018-07-04 MED ORDER — SODIUM CHLORIDE 0.9 % IV SOLN
250.0000 mL | INTRAVENOUS | Status: DC | PRN
  Administered 2018-07-05: 250 mL via INTRAVENOUS

## 2018-07-04 MED ORDER — POTASSIUM CHLORIDE 20 MEQ/15ML (10%) PO SOLN
40.0000 meq | Freq: Two times a day (BID) | ORAL | Status: DC
  Administered 2018-07-04: 40 meq via ORAL
  Filled 2018-07-04: qty 30

## 2018-07-04 NOTE — Progress Notes (Signed)
IMTS INTERVAL PROGRESS NOTE  Paged by RN at 8:20pm over concern that patient was tachypnec in the 40's, hypertensive with BP 238/77 and trends showing widened pulse pressure. Patient also with complaint of headache, chest pain and overall feeling unwell. IV Hydral 10mg  x2 and 1mg  Morphine administered without improvement. She was seen promptly at bedside.   Vitals: BP 225/87, pulse 110, respirations 38 and saturating 96% on 4L via [place].  Exam: Frail and acutely-ill appearing elderly female laying in bed, distressed. 2RN and RT at bedside. Diaphoretic and flushed. Opens eyes occasionally, does not respond to questions. Occasionally mumbles. Cortrack and Rolfe in place. Tachycardic and tachypnec. Pt unable to comply with respiratory exam but crackles LLL.   STAT EKG with Sinus tachycardia but no ST segment elevations. Telemetry: tachycardia and occasional PVCs.   Assessment: 84 y/o F [facility] who presented 7/9 with right hemiparesis and right facial droop. Found to have left vertebral artery occlusion and left pontine infarct. Course complicated by aphasia, frank dysphagia/aspiration (currently on Unasyn for LLL aspiration PNA), and persistent right hemiparesis. Given [facility] I worry about progression of disease and her worsened respiratory status. Have asked PCCM to evaluate for possible transfer to higher level of care. STAT head CT ordered.   ADDENDUM:  CT head without obvious progression of disease. CXR with worsening L>R bibasilar airspace opacities most consistent with aspiration PNA. CCM has transferred pt to ICU and pt now DNI per family. Happy to resume care once patient stable for transfer to floor.   Noemi ChapelBethany Penina Reisner, DO Internal Medicine PGY-3 Pgr [phone]

## 2018-07-04 NOTE — Significant Event (Signed)
Rapid Response Event Note  Overview: Transfer to the NTICU  Initial Focused Assessment: Staff RN called me and asked for assistance in transferring patient to the ICU. Per staff, patient is hypoxic and has been hypertensive into the 200s.  PCCM was already at bedside when I arrived.  Interventions: - SBP treated with Labetalol IV  Plan of Care: - Transfer to NTICU  Event Summary:   at    Call Time 2130 Arrival Time 2133 End Time 2215  Gina Boone R

## 2018-07-04 NOTE — Progress Notes (Signed)
Pt's SBP was greater than 230 during bedside shift report. PRN hydralazine administered per order with minimal effect. Pt became tachypnic in upper 30's-40's. MD Petra KubaVogel notified of pt's continued hypertension as well as tachypnea and discomfort. MD Amin to bedside to examine pt; ordered an additional 10 mg hydralazine, 1 mg morphine, STAT EKG. Pt's SBP decreased by 10-20 mmHg as a result of the hydralazine and RT called to bedside to evaluate patient's respiratory needs/status. EKG showed no ST elevation, CT showed no bleed. Pt became tachycardic into 140's when returned to room. CXR showed bilateral pleural effusions, MD ordered tube feed d/c'd. New IV started and 5mg  labetalol administered by Leitha BleakKatalina, NP for SBP >220 effectively treating HTN.  Daughters at bedside made pt DNR/DNI with ICU MD and pt to be transferred to ICU for BP gttp. Report given to [place], RN and pt transferred to N416.

## 2018-07-04 NOTE — Consult Note (Signed)
PULMONARY / CRITICAL CARE MEDICINE   Name: Gina Boone MRN: [id number] DOB: 1929/11/01    ADMISSION DATE:  07/20/2018 CONSULTATION DATE:  07/04/2018  REFERRING MD:  Dr. Vicente Masson   CHIEF COMPLAINT:  Hypoxia/Hypertension   HISTORY OF PRESENT ILLNESS:   84 year old female with PMH of HTN, HLD, Stage 3 CKD  Recent Admission at [facility] 6/23-6/29 with small volume right frontal cortical SAH vs bone artifact.   Presents to ED 7/8 with right hemiparesis, MRI with let vertebral artery occlusion and left pontine infarct. During swallowing evaluation on 7/09 patient with witnessed aspiration of barium. On 7/13 patient with progressive HTN and hypoxia requiring transfer to ICU.   PAST MEDICAL HISTORY :  She  has a past medical history of Hypertension, Kidney stones, and Stroke (HCC).  PAST SURGICAL HISTORY: She  has a past surgical history that includes renal stents and Appendectomy (2012).  Allergies  Allergen Reactions  . Amlodipine Other (See Comments)    fatigue  . Hydrochlorothiazide Other (See Comments)    Hair loss  . Ibuprofen     Affects her kidneys  . Penicillins Other (See Comments)    Has patient had a PCN reaction causing immediate rash, facial/tongue/throat swelling, SOB or lightheadedness with hypotension: Unknown Has patient had a PCN reaction causing severe rash involving mucus membranes or skin necrosis: Unknown Has patient had a PCN reaction that required hospitalization: Unknown Has patient had a PCN reaction occurring within the last 10 years: Unknown If all of the above answers are "NO", then may proceed with Cephalosporin use. **Tolerated Unasyn July 2019.     No current facility-administered medications on file prior to encounter.    Current Outpatient Medications on File Prior to Encounter  Medication Sig  . Biotin 2.5 MG TABS Take 2.5 mg by mouth daily.  . carvedilol (COREG) 6.25 MG tablet Take 6.25 mg by mouth 2 (two) times daily with a meal.  . cholecalciferol  (VITAMIN D) 1000 units tablet Take 5,000 Units by mouth daily.  . cyanocobalamin 100 MCG tablet Take 100 mcg by mouth daily.  . furosemide (LASIX) 40 MG tablet Take 60 mg by mouth daily.  . niacin 500 MG tablet Take 500 mg by mouth at bedtime.  Marland Kitchen NIFEdipine (PROCARDIA-XL/ADALAT CC) 60 MG 24 hr tablet Take 60 mg by mouth daily.    FAMILY HISTORY:  Her family history includes Heart disease in her mother; High blood pressure in her brother.  SOCIAL HISTORY: She  reports that she has never smoked. She has never used smokeless tobacco. She reports that she does not drink alcohol or use drugs.  REVIEW OF SYSTEMS:   All negative; except for those that are bolded, which indicate positives.  Constitutional: weight loss, weight gain, night sweats, fevers, chills, fatigue, weakness.  HEENT: headaches, sore throat, sneezing, nasal congestion, post nasal drip, difficulty swallowing, tooth/dental problems, visual complaints, visual changes, ear aches. Neuro: difficulty with speech, weakness, numbness, ataxia. CV:  chest pain, orthopnea, PND, swelling in lower extremities, dizziness, palpitations, syncope.  Resp: cough, hemoptysis, dyspnea, wheezing. GI: heartburn, indigestion, abdominal pain, nausea, vomiting, diarrhea, constipation, change in bowel habits, loss of appetite, hematemesis, melena, hematochezia.  GU: dysuria, change in color of urine, urgency or frequency, flank pain, hematuria. MSK: joint pain or swelling, decreased range of motion. Psych: change in mood or affect, depression, anxiety, suicidal ideations, homicidal ideations. Skin: rash, itching, bruising.   SUBJECTIVE:   VITAL SIGNS: BP (!) 204/91   Pulse 92   Temp  98.7 F (37.1 C) (Oral)   Resp (!) 33   Ht 5\' 4"  (1.626 m)   Wt 56.4 kg (124 lb 5.4 oz)   SpO2 94%   BMI 21.34 kg/m   HEMODYNAMICS:    VENTILATOR SETTINGS:    INTAKE / OUTPUT: I/O last 3 completed shifts: In: 1137 [NG/GT:1137] Out: -   PHYSICAL  EXAMINATION: General:  Elderly female, moderate respiratory distress  Neuro:  Alert, follows commands, slow to responds  HEENT:  Dry MM Cardiovascular:  Tachy, no MRG  Lungs:  Rhonchi, no wheeze, crackles to bases  Abdomen:  Non-distended  Musculoskeletal:  +2 BLE edema  Skin:  Warm, dry   LABS:  BMET Recent Labs  Lab 07/02/18 0459 07/03/18 0436 07/04/18 0359  NA 135 136 138  K 3.2* 3.1* 3.1*  CL 98 98 103  CO2 25 25 23   BUN 22 27* 26*  CREATININE 1.42* 1.24* 1.07*  GLUCOSE 160* 131* 103*    Electrolytes Recent Labs  Lab 07/02/18 0459 07/02/18 1729 07/03/18 0436 07/04/18 0359  CALCIUM 8.8*  --  9.0 9.1  MG 2.0 2.0 2.0 2.2  PHOS 1.8* 2.3* 2.1* 3.6    CBC Recent Labs  Lab 06/28/2018 1729 07/22/2018 1814 07/02/18 0459  WBC  --  4.8 6.4  HGB 16.0* 14.6 13.5  HCT 47.0* 44.7 40.4  PLT  --  272 261    Coag's Recent Labs  Lab 07/03/2018 1814  APTT 29  INR 1.02    Sepsis Markers No results for input(s): LATICACIDVEN, PROCALCITON, O2SATVEN in the last 168 hours.  ABG Recent Labs  Lab 07/01/18 2325  PHART 7.535*  PCO2ART 29.1*  PO2ART 83.9    Liver Enzymes Recent Labs  Lab 07/20/2018 1814  AST 38  ALT 22  ALKPHOS 88  BILITOT 1.4*  ALBUMIN 2.9*    Cardiac Enzymes No results for input(s): TROPONINI, PROBNP in the last 168 hours.  Glucose Recent Labs  Lab 07/03/18 2322 07/04/18 0331 07/04/18 0738 07/04/18 1144 07/04/18 1646 07/04/18 2016  GLUCAP 92 102* 114* 107* 100* 100*    Imaging Ct Head Wo Contrast  Result Date: 07/04/2018 CLINICAL DATA:  Followup stroke.  Headache. EXAM: CT HEAD WITHOUT CONTRAST TECHNIQUE: Contiguous axial images were obtained from the base of the skull through the vertex without intravenous contrast. COMPARISON:  MRI 07/02/2018 FINDINGS: Brain: Low-density now visible in the left para median pons consistent with the area of infarction shown by MRI. No evidence of hemorrhage or extension. No focal cerebellar  abnormality. Cerebral hemispheres show atrophy with mild small vessel change of the deep white matter. No acute finding. No hemorrhage, mass, hydrocephalus or extra-axial collection. Vascular: There is atherosclerotic calcification of the major vessels at the base of the brain. Skull: Negative Sinuses/Orbits: Clear/normal Other: None IMPRESSION: No new abnormality is seen. Low-density can be appreciated in the region of infarction in the left para median pons as shown by previous MRI. No evidence that the stroke has extended or hemorrhaged. Electronically Signed   By: Paulina FusiMark  Shogry M.D.   On: 07/04/2018 21:26   Dg Chest Port 1 View  Result Date: 07/04/2018 CLINICAL DATA:  Acute respiratory failure. EXAM: PORTABLE CHEST 1 VIEW COMPARISON:  07/01/2018 and 06/30/2018. FINDINGS: 2114 hour. The heart size and mediastinal contours are stable. There is aortic atherosclerosis. Feeding tube remains in place, projecting below the diaphragm, tip not visualized. There has been significant worsening of the left-greater-than-right basilar airspace opacities, likely due to aspiration pneumonia. Overall aeration of  the retrocardiac left lower lobe has mildly improved. There is no significant pleural effusion or pneumothorax. IMPRESSION: Worsening left-greater-than-right bibasilar airspace opacities consistent with aspiration pneumonia. These results will be called to the ordering clinician or representative by the Radiologist Assistant, and communication documented in the PACS or zVision Dashboard. Electronically Signed   By: Carey Bullocks M.D.   On: 07/04/2018 21:46     STUDIES:  CT Head 7/08 > Linear lucency in left paramedian pons compatible with pontine perforator infarct, age indeterminate. 2. Mild chronic microvascular ischemic changes and parenchymal volume loss of the brain for age. CTA Head/Neck 7/08 > Normal CT brain perfusion. 2. Occlusion of the left vertebral artery V3 and proximal V4 segments, age  indeterminate. Patent diminutive left vertebral artery between vertebrobasilar junction and left PICA origin. 3. Patent carotid systems and right vertebral artery without dissection or high-grade stenosis. 4. Patent circle-of-Willis, bilateral ACA, bilateral MCA, and bilateral PCA. No large vessel occlusion or aneurysm. 5. Calcified plaque of carotid bifurcations with bilateral proximal ICA mild less 50% stenosis. 6. Calcified plaque of carotid siphons with moderate bilateral paraclinoid stenosis. MR Brain 7/08 > 1. Left paramedian pontine acute/early subacute infarction. No associated hemorrhage or mass effect. 2. 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 No additional intracranial large vessel occlusion. 3. Stable chronic microvascular ischemic changes and parenchymal volume loss of the brain CXR 7/09 > Barium in the bronchial tree is more blunted on the left and consistent with aspiration today. Lungs are clear CXR 7/10 > Cardiomegaly with mild pulmonary edema. Hazy right lower lobe airspace disease which may reflect atelectasis versus pneumonia CT Head 7/13 > No new abnormality is seen. Low-density can be appreciated in the region of infarction in the left para median pons as shown by previous MRI. No evidence that the stroke has extended or hemorrhaged. CXR 7/13 > Worsening left-greater-than-right bibasilar airspace opacities consistent with aspiration pneumonia.  CULTURES: U/A 7/13 > Negative   ANTIBIOTICS: Unasyn 7/10 >>   SIGNIFICANT EVENTS: 7/08 > Presents to ED  7/13 > Transferred to ICU   LINES/TUBES: PIV   DISCUSSION: 84 year old female presents to ED 7/8 with right hemiparesis, MRI with let vertebral artery occlusion and left pontine infarct. During swallowing evaluation on 7/09 patient with witnessed aspiration of barium. On 7/13 patient with progressive HTN and hypoxia  requiring transfer to ICU.   ASSESSMENT / PLAN:  Acute Hypoxic Respiratory Failure with  Aspiration PNA > witnessed aspiration during swallowing function test with barium  CXR with worsening left-greater-than-right bibasilar opacities  P:   Wean Supplemental Oxygen to maintain Saturation >92 Pulmonary Hygiene  Aspiration Precautions  Per Family Now DNI   HTN H/O HLD P:  Cardiac Monitoring  Maintain Systolic <220 (Per Neurology Permissive HTN okay for first 5-7 days - which would expire 7/15)  Continue Hydralazine 25 mg q8h (added 7/13 am)  Continue Clonidine Patch  PRN Hydralazine if needed   Volume Overload (+5L)  Hypokalemia  Stage 3 CKD  P:   Trend BMP  Replace electrolytes as indicated Place Foley Cath for Strict I&O  Lasix 20 meq once (Takes Lasix 60 meq daily at home) > Re-dose as needed   Dysphagia in setting of CVA P:   NPO Aspiration Precautions  Speech Therapy Following   Leucocytosis with aspiration PNA Diarrhea > Suspected Secondary to Tube Feeds  P:   Trend WBC and Fever curve Trend LA  and PCT Send U/A > Negative  Continue Unasyn   Left Vertebral Artery occlusion and left Pontine Infarct  H/O SAH > Now Resolved  -Repeat CT Head with no acute  P:   Neurology Signed Off  Routine Neuro Checks  BP Control as above   FAMILY  - Updates: Family updated at bedside by Dr. Carlota Raspberry, agree to to DNR/DNI   - Inter-disciplinary family meet or Palliative Care meeting due by:  07/11/2018    Jovita Kussmaul, AGACNP-BC [facility]  Pgr: [phone]  PCCM Pgr: [phone]

## 2018-07-04 NOTE — Progress Notes (Signed)
   Subjective: Patient appears more comfortable when seen this morning.  Denies any pain or difficulty with breathing.  Both daughters were at bedside.  According to them she is trying to clear her throat today.  There was some nursing concern about mild abdominal distention and having 2 episodes of loose bowel movements, most likely secondary to tube feeding.  Patient denies any abdominal pain.  Objective:  Vital signs in last 24 hours: Vitals:   07/04/18 0310 07/04/18 0325 07/04/18 0757 07/04/18 1208  BP: (!) 194/73 (!) 187/63 (!) 177/64 (!) 171/60  Pulse: 84 79 65 73  Resp: (!) 27 (!) 23 19 (!) 30  Temp: 98.1 F (36.7 C)  98.7 F (37.1 C) 98.5 F (36.9 C)  TempSrc: Oral  Axillary Axillary  SpO2: 95% 94% 98% 95%  Weight: 124 lb 5.4 oz (56.4 kg)     Height:       General.  Well-developed, frail elderly lady, laying comfortably with her eyes closed, responding to yes and no questions, in no acute distress., Lungs.  Normal work of breathing, bilateral scattered rhonchi more on left. CV.  Regular rate and rhythm. Abdomen.  Soft, nontender, mildly distended, bowel sounds positive. Neuro.  Appears little drowsy, was able to wake her up easily, responding to yes and no questions appropriately, strength remain 0/5 in RUE and 1/5 in RLE, 5/5 at both left upper and lower extremities. Extremities.  1+ right lower extremity edema, pulses intact.  Assessment/Plan:  [name] with recent history of probable SAH 6/23, TIA one week ago who presented 7/9 with righthemiparesisin the UE &LE &right facial droop.   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  Motor strength remained unchanged.  According to daughters patient is clearing her throat today.  Remains high risk for aspiration. -Continue aspirin 325 mg daily. -Continue to feed with cortrak. -Transferred to CIR once stable from respiratory standpoint.  Acute hypoxemia secondary to  aspiration.  Breathing improved, saturating in high 90s on 2 L today.  Patient remains high risk for further aspiration. -Continue Unasyn. -Continue pulmonary toileting. -Continue patient swallow therapy.  Uncontrolled HTN: Blood pressure remained elevated in 170s. -Add hydralazine 25 mg every 8 hourly. -If remain elevated we can increase her dose of clonidine from 0.1 to 0.2/24h patch. -Discontinue PRN labetalol as patient is becoming bradycardic intermittently. -Add hydralazine 10 mg IV as needed for systolic above 200.  Abdominal distention/diarrhea.  Patient denies any abdominal pain, with good bowel sounds.  Most likely secondary to tube feed. -We will ask nutritionist to modify her tube feed if possible.  Right leg swelling.  Chronic issue with negative venous Doppler. -Not a good candidate for pharmacologic DVT prophylaxis [facility]. -Continue SCDs.  Electrolytes K, Mg, Phos.  Hypokalemia with potassium at 3.1 with normal magnesium and phosphate today. -Replete potassium. -BMP tomorrow morning. -Lasix was discontinued yesterday.   Dispo: Anticipated discharge in approximately 2-3 day(s).   Arnetha CourserAmin, Osias Resnick, MD 07/04/2018, 2:15 PM Pager: [phone]

## 2018-07-04 NOTE — Progress Notes (Signed)
Pharmacy Antibiotic Note  Gina Boone is a 84 y.o. female admitted on 07/15/2018 with new R hemiparesis.  Pt was initiated on Unasyn on 7/10 after concerns for aspiration PNA.  Pt remains NPO but continues to work with SLP therapy.  Today is day #4 of antibiotic therapy.  Plan: Continue Unasyn 1.5gm IV q12h Pt has a core-trak feeding tube.  Could switch to Augmentin suspension to complete course pending overall clinical improvement.  Height: 5\' 4"  (162.6 cm) Weight: 124 lb 5.4 oz (56.4 kg) IBW/kg (Calculated) : 54.7  Temp (24hrs), Avg:98.1 F (36.7 C), Min:97.6 F (36.4 C), Max:98.7 F (37.1 C)  Recent Labs  Lab 07/14/2018 1814 06/30/18 0338 07/01/18 0528 07/02/18 0459 07/03/18 0436 07/04/18 0359  WBC 4.8  --   --  6.4  --   --   CREATININE 1.42* 1.27* 1.18* 1.42* 1.24* 1.07*    Estimated Creatinine Clearance: 32 mL/min (A) (by C-G formula based on SCr of 1.07 mg/dL (H)).    Allergies  Allergen Reactions  . Amlodipine Other (See Comments)    fatigue  . Hydrochlorothiazide Other (See Comments)    Hair loss  . Ibuprofen     Affects her kidneys  . Penicillins Other (See Comments)    Has patient had a PCN reaction causing immediate rash, facial/tongue/throat swelling, SOB or lightheadedness with hypotension: Unknown Has patient had a PCN reaction causing severe rash involving mucus membranes or skin necrosis: Unknown Has patient had a PCN reaction that required hospitalization: Unknown Has patient had a PCN reaction occurring within the last 10 years: Unknown If all of the above answers are "NO", then may proceed with Cephalosporin use. **Tolerated Unasyn July 2019.     Antimicrobials this admission: Unasyn 7/10 >>  Dose adjustments this admission:  Microbiology results:  Thank you for allowing pharmacy to be a part of this patient's care.  Toys 'R' UsKimberly Raisha Brabender, Pharm.D., BCPS Clinical Pharmacist Pager: [phone] Clinical phone for 07/04/2018 from 8:30-4:00 is  [phone].  **Pharmacist phone directory can now be found on [email or web address] (PW [email or web address]).  Listed under [facility].  07/04/2018 10:44 AM

## 2018-07-05 DIAGNOSIS — T17908A Unspecified foreign body in respiratory tract, part unspecified causing other injury, initial encounter: Secondary | ICD-10-CM

## 2018-07-05 DIAGNOSIS — J690 Pneumonitis due to inhalation of food and vomit: Secondary | ICD-10-CM

## 2018-07-05 DIAGNOSIS — R471 Dysarthria and anarthria: Secondary | ICD-10-CM

## 2018-07-05 DIAGNOSIS — J9601 Acute respiratory failure with hypoxia: Secondary | ICD-10-CM

## 2018-07-05 DIAGNOSIS — R0902 Hypoxemia: Secondary | ICD-10-CM

## 2018-07-05 DIAGNOSIS — I6330 Cerebral infarction due to thrombosis of unspecified cerebral artery: Secondary | ICD-10-CM

## 2018-07-05 LAB — GLUCOSE, CAPILLARY: Glucose-Capillary: 113 mg/dL — ABNORMAL HIGH (ref 70–99)

## 2018-07-05 LAB — BRAIN NATRIURETIC PEPTIDE: B Natriuretic Peptide: 437.7 pg/mL — ABNORMAL HIGH (ref 0.0–100.0)

## 2018-07-05 LAB — LACTIC ACID, PLASMA
Lactic Acid, Venous: 2 mmol/L (ref 0.5–1.9)
Lactic Acid, Venous: 1.6 mmol/L (ref 0.5–1.9)

## 2018-07-05 LAB — MRSA PCR SCREENING: MRSA BY PCR: NEGATIVE

## 2018-07-05 MED ORDER — MORPHINE SULFATE (PF) 2 MG/ML IV SOLN
1.0000 mg | INTRAVENOUS | Status: DC | PRN
  Administered 2018-07-05 (×3): 1 mg via INTRAVENOUS
  Filled 2018-07-05 (×3): qty 1

## 2018-07-05 MED ORDER — SIMETHICONE 40 MG/0.6ML PO SUSP
80.0000 mg | Freq: Once | ORAL | Status: AC
  Filled 2018-07-05: qty 1.2

## 2018-07-05 MED ORDER — MORPHINE 100MG IN NS 100ML (1MG/ML) PREMIX INFUSION
10.0000 mg/h | INTRAVENOUS | Status: DC
  Administered 2018-07-05: 10 mg/h via INTRAVENOUS
  Administered 2018-07-06: 8 mg/h via INTRAVENOUS
  Administered 2018-07-06 – 2018-07-08 (×4): 10 mg/h via INTRAVENOUS
  Filled 2018-07-05 (×6): qty 100

## 2018-07-05 MED ORDER — MORPHINE BOLUS VIA INFUSION
5.0000 mg | INTRAVENOUS | Status: DC | PRN
  Filled 2018-07-05: qty 20

## 2018-07-05 NOTE — Progress Notes (Signed)
   07/05/18 1300  Clinical Encounter Type  Visited With Patient and family together;Health care provider  Visit Type Initial;Patient actively dying (End of Life Consult)  Referral From Nurse  Consult/Referral To Chaplain  Spiritual Encounters  Spiritual Needs Prayer   Responded to a SCC for EOL.  Nurse introduced me to the family.  Daughters and other relatives at bedside.  Shared some stories about their life and that [place] has lived in [place] about [place] years.  Patient had been an active member of the [employer] and her pastor has visited and knows about EOL.  We prayed together.  I let the family know that we are here all day and night if they need anything at all.  Will follow and support as needed. Chaplain Agustin CreeNewton Jowanda Heeg

## 2018-07-05 NOTE — Progress Notes (Signed)
eLink Physician-Brief Progress Note Patient Name: Gina Boone DOB: September 28, 1929 MRN: [id number]   Date of Service  07/05/2018  HPI/Events of Note  Patient c/o mild abdominal pain. Gas pain?  eICU Interventions  Will order: 1. Mylicon suspension 80 mg per tube X 1 now.      Intervention Category Intermediate Interventions: Pain - evaluation and management  Naleigha Raimondi Eugene 07/05/2018, 12:28 AM

## 2018-07-05 NOTE — Progress Notes (Signed)
CRITICAL VALUE ALERT  Critical Value:  Lactic Acid 2.0  Date & Time Notied:  07/05/18 0005  Provider Notified: E-Link  Orders Received/Actions taken: Awaiting new orders.

## 2018-07-05 NOTE — Progress Notes (Signed)
PULMONARY / CRITICAL CARE MEDICINE   Name: Gina Boone MRN: [id number] DOB: Mar 19, 1929    ADMISSION DATE:  07/17/2018 CONSULTATION DATE:  07/04/2018  REFERRING MD:  Dr. Vicente Masson   CHIEF COMPLAINT:  Hypoxia/Hypertension   HISTORY OF PRESENT ILLNESS:   84 year old female with PMH of HTN, HLD, Stage 3 CKD  Recent Admission at [facility] 6/23-6/29 with small volume right frontal cortical SAH vs bone artifact.   Presents to ED 7/8 with right hemiparesis, MRI with let vertebral artery occlusion and left pontine infarct. During swallowing evaluation on 7/09 patient with witnessed aspiration of barium. On 7/13 patient with progressive HTN and hypoxia requiring transfer to ICU.   SUBJECTIVE:  No events overnight, of BP drips at this point  VITAL SIGNS: BP (!) 144/58   Pulse 76   Temp 97.6 F (36.4 C) (Axillary)   Resp (!) 26   Ht 5\' 4"  (1.626 m)   Wt 116 lb 10 oz (52.9 kg)   SpO2 97%   BMI 20.02 kg/m   HEMODYNAMICS:    VENTILATOR SETTINGS:    INTAKE / OUTPUT: I/O last 3 completed shifts: In: 2488.8 [NG/GT:2217.8; IV Piggyback:271] Out: 925 [Urine:925]  PHYSICAL EXAMINATION: General:  Elderly female, moderate respiratory distress  Neuro:  Slow to respond but moving all ext to command HEENT:  [place]/AT, PERRL, EOM-I and DMM Cardiovascular:  Sinus tach Lungs:  Coarse BS diffusely Abdomen:  Soft, NT, ND and +BS Musculoskeletal:  +1 BLE edema  Skin:  Warm, dry   LABS:  BMET Recent Labs  Lab 07/03/18 0436 07/04/18 0359 07/04/18 2225  NA 136 138 139  K 3.1* 3.1* 3.6  CL 98 103 107  CO2 25 23 21*  BUN 27* 26* 25*  CREATININE 1.24* 1.07* 0.98  GLUCOSE 131* 103* 115*    Electrolytes Recent Labs  Lab 07/02/18 1729 07/03/18 0436 07/04/18 0359 07/04/18 2225  CALCIUM  --  9.0 9.1 9.2  MG 2.0 2.0 2.2 2.3  PHOS 2.3* 2.1* 3.6  --     CBC Recent Labs  Lab 07/12/2018 1814 07/02/18 0459 07/04/18 2225  WBC 4.8 6.4 12.3*  HGB 14.6 13.5 14.3  HCT 44.7 40.4 43.8  PLT 272 261  372    Coag's Recent Labs  Lab 07/15/2018 1814  APTT 29  INR 1.02    Sepsis Markers Recent Labs  Lab 07/04/18 2225 07/05/18 0245  LATICACIDVEN 2.0* 1.6  PROCALCITON 4.75  --     ABG Recent Labs  Lab 07/01/18 2325  PHART 7.535*  PCO2ART 29.1*  PO2ART 83.9    Liver Enzymes Recent Labs  Lab 06/27/2018 1814 07/04/18 2225  AST 38 70*  ALT 22 64*  ALKPHOS 88 151*  BILITOT 1.4* 0.6  ALBUMIN 2.9* 1.9*    Cardiac Enzymes No results for input(s): TROPONINI, PROBNP in the last 168 hours.  Glucose Recent Labs  Lab 07/04/18 0738 07/04/18 1144 07/04/18 1646 07/04/18 2016 07/04/18 2320 07/05/18 0347  GLUCAP 114* 107* 100* 100* 114* 113*    Imaging Ct Head Wo Contrast  Result Date: 07/04/2018 CLINICAL DATA:  Followup stroke.  Headache. EXAM: CT HEAD WITHOUT CONTRAST TECHNIQUE: Contiguous axial images were obtained from the base of the skull through the vertex without intravenous contrast. COMPARISON:  MRI 07/06/2018 FINDINGS: Brain: Low-density now visible in the left para median pons consistent with the area of infarction shown by MRI. No evidence of hemorrhage or extension. No focal cerebellar abnormality. Cerebral hemispheres show atrophy with mild small vessel change of  the deep white matter. No acute finding. No hemorrhage, mass, hydrocephalus or extra-axial collection. Vascular: There is atherosclerotic calcification of the major vessels at the base of the brain. Skull: Negative Sinuses/Orbits: Clear/normal Other: None IMPRESSION: No new abnormality is seen. Low-density can be appreciated in the region of infarction in the left para median pons as shown by previous MRI. No evidence that the stroke has extended or hemorrhaged. Electronically Signed   By: Paulina FusiMark  Shogry M.D.   On: 07/04/2018 21:26   Dg Chest Port 1 View  Result Date: 07/04/2018 CLINICAL DATA:  Acute respiratory failure. EXAM: PORTABLE CHEST 1 VIEW COMPARISON:  07/01/2018 and 06/30/2018. FINDINGS: 2114  hour. The heart size and mediastinal contours are stable. There is aortic atherosclerosis. Feeding tube remains in place, projecting below the diaphragm, tip not visualized. There has been significant worsening of the left-greater-than-right basilar airspace opacities, likely due to aspiration pneumonia. Overall aeration of the retrocardiac left lower lobe has mildly improved. There is no significant pleural effusion or pneumothorax. IMPRESSION: Worsening left-greater-than-right bibasilar airspace opacities consistent with aspiration pneumonia. These results will be called to the ordering clinician or representative by the Radiologist Assistant, and communication documented in the PACS or zVision Dashboard. Electronically Signed   By: Carey BullocksWilliam  Veazey M.D.   On: 07/04/2018 21:46     STUDIES:  CT Head 7/08 > Linear lucency in left paramedian pons compatible with pontine perforator infarct, age indeterminate. 2. Mild chronic microvascular ischemic changes and parenchymal volume loss of the brain for age. CTA Head/Neck 7/08 > Normal CT brain perfusion. 2. 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 Patent diminutive left vertebral artery between vertebrobasilar junction and left PICA origin. 3. Patent carotid systems and right vertebral artery without dissection or high-grade stenosis. 4. Patent circle-of-Willis, bilateral ACA, bilateral MCA, and bilateral PCA. No large vessel occlusion or aneurysm. 5. Calcified plaque of carotid bifurcations with bilateral proximal ICA mild less 50% stenosis. 6. Calcified plaque of carotid siphons with moderate bilateral paraclinoid stenosis. MR Brain 7/08 > 1. Left paramedian pontine acute/early subacute infarction. No associated hemorrhage or mass effect. 2. 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 No additional intracranial large  vessel occlusion. 3. Stable chronic microvascular ischemic changes and parenchymal volume loss of the brain CXR 7/09 > Barium in the bronchial tree is more blunted on the left and consistent with aspiration today. Lungs are clear CXR 7/10 > Cardiomegaly with mild pulmonary edema. Hazy right lower lobe airspace disease which may reflect atelectasis versus pneumonia CT Head 7/13 > No new abnormality is seen. Low-density can be appreciated in the region of infarction in the left para median pons as shown by previous MRI. No evidence that the stroke has extended or hemorrhaged. CXR 7/13 > Worsening left-greater-than-right bibasilar airspace opacities consistent with aspiration pneumonia.  CULTURES: U/A 7/13 > Negative   ANTIBIOTICS: Unasyn 7/10 >>   SIGNIFICANT EVENTS: 7/08 > Presents to ED  7/13 > Transferred to ICU   LINES/TUBES: PIV   DISCUSSION: 84 year old female presents to ED 7/8 with right hemiparesis, MRI with let vertebral artery occlusion and left pontine infarct. During swallowing evaluation on 7/09 patient with witnessed aspiration of barium. On 7/13 patient with progressive HTN and hypoxia requiring transfer to ICU.   ASSESSMENT / PLAN:  Had an extensive conversation with the daughters this AM.  She remains tachycardic and hypertensive consistent with pain.  The patient is unable to verbalize where the pain is coming from.  She is clearly unable to protect her airway and is in respiratory failure.  After a long discussion, decision was made to make patient a full DNR and proceed with comfort care.  Will transfer patient to the palliative care floor and back to IMTS with PCCM off.  The patient is critically ill with multiple organ systems failure and requires high complexity decision making for assessment and support, frequent evaluation and titration of therapies, application of advanced monitoring technologies and extensive interpretation of multiple databases.    Critical Care Time devoted to patient care services described in this note is  32  Minutes. This time reflects time of care of this signee Dr Koren Bound. This critical care time does not reflect procedure time, or teaching time or supervisory time of PA/NP/Med student/Med Resident etc but could involve care discussion time.  Alyson Reedy, M.D. [facility] Pulmonary/Critical Care Medicine. Pager: [phone]. After hours pager: [phone].

## 2018-07-06 DIAGNOSIS — I10 Essential (primary) hypertension: Secondary | ICD-10-CM

## 2018-07-06 DIAGNOSIS — R140 Abdominal distension (gaseous): Secondary | ICD-10-CM

## 2018-07-06 MED ORDER — ATROPINE SULFATE 1 % OP SOLN
2.0000 [drp] | Freq: Four times a day (QID) | OPHTHALMIC | Status: DC | PRN
  Filled 2018-07-06: qty 2

## 2018-07-06 NOTE — Progress Notes (Signed)
[facility] Liaison RN note.  Received request from Danelle Earthly, [place] for family interest in [place] with request for transfer 07/07/2018. Chart reviewed and patient approved for admission to [facility]. Met with family to confirm interest and explain services. Family agreeable to transfer 07/07/2018. Danelle Earthly, CSW aware.  Registration paper work completed. Dr. Orpah Melter  to assume care per family request. Please fax discharge summary to [phone]. RN please call report to [phone]. Please arrange transport for patient to arrive before noon if possible.  Thank you,   Farrel Gordon, RN, [facility] Liaison  [place] are on AMION

## 2018-07-06 NOTE — Progress Notes (Signed)
3ml of Morphine 100mg  (1mg /ml) wasted in sink with Opal SidlesElena Thorp RN as a witness.

## 2018-07-06 NOTE — Progress Notes (Signed)
Internal Medicine Attending  Date: 07/06/2018  Patient name: Gina Boone Medical record number: [id number] Date of birth: 1929-06-21 Age: 84 y.o. Gender: female  I saw and evaluated the patient. I reviewed the resident's note by Dr. Cleaster CorinSeawell and I agree with the resident's findings and plans as documented in her progress note.  When seen on rounds this morning Gina Boone was resting comfortably on a morphine drip. Her family was accepting of her current situation and she has subsequently been [facility] to inpatient hospice tomorrow. We will continue current comfort measures.

## 2018-07-06 NOTE — Progress Notes (Signed)
Inpatient Rehabilitation Admissions Coordinator  Noted pt is comfort care. I will sign off at this time.  Ottie GlazierBarbara Jenay Morici, RN, MSN Rehab Admissions Coordinator [phone] 07/06/2018 8:22 AM

## 2018-07-06 NOTE — Progress Notes (Signed)
   Subjective: [name] was made comfort care this weekend and was sleeping and appeared comfortable on visit. She was accompanied by both of her daughters at bedside. They stated she seemed much more comfortable than they had seen her over the weekend. We did not wake her to allow her to rest.   Objective:  Vital signs in last 24 hours: Vitals:   07/06/18 1100 07/06/18 1300 07/06/18 1400 07/06/18 1500  BP:      Pulse: 87 86 86 84  Resp: 13 14 14 14   Temp:      TempSrc:      SpO2: 93% 94% 94% 94%  Weight:      Height:       Constitution: pale, ill-appearing, asleep in bed, NAD HENT: right sided facial droop, atraumatic Cardio: RRR, no murmurs Respiratory: non-labored breathing Abdominal: +bs, non-distended MSK: no LE or UE edema Skin: clean, dry, and intact   Assessment/Plan:  Active Problems:   Cerebral thrombosis with cerebral infarction   Left pontine stroke (HCC)   Aspiration into airway   Dysarthria   Essential hypertension   Pure hypercholesterolemia   Malnutrition of moderate degree   Aspiration pneumonia of left lower lobe [facility](HCC)   Advanced care planning/counseling discussion   Palliative care by specialist   Goals of care, counseling/discussion   Acute respiratory failure with hypoxia [facility](HCC)   Hypoxia  [name] with recent history of probable SAH 6/23, TIA one week ago who presented 7/9 with righthemiparesisin the UE &LE &right facial droop. Two days ago she increased labor of breathing, hypoxia, and hypertensive urgency. CXR showed increased aspiration and PNA in the left and right lower lobes. She was transferred to ICU, and yesterday was made comfort care and place on morphine drip. Unasyn was stopped. Our team spoke with her daughters this am about whether they would like to continue with home hospice or inpatient hospice. They decided on inpatient. Representatives from [facility] will meet with them this afternoon or tomorrow morning.    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  - Morphine 1mg /mL infusion 8mg /hr IV cont.   Acute hypoxemia secondary to aspiration.  Breathing improved, saturating in high 90s on 2 L today.  Patient remains high risk for further aspiration. - Unasyn stopped as patient is comfort care  Uncontrolled HTN: Blood pressure remained elevated in 170s. - bp medications stopped   Abdominal distention/diarrhea.  Patient denies any abdominal pain, with good bowel sounds.  Most likely secondary to tube feed. - simethicone  Right leg swelling.  Chronic issue with negative venous Doppler    Dispo: Anticipated discharge in approximately 1 day.   Guinevere ScarletSeawell, Jaimie A, DO 07/06/2018, 3:51 PM Pager: [phone]

## 2018-07-06 NOTE — Progress Notes (Signed)
Patient transferred to 6N12. Belongings given to family at bedside. June Vacha, [place] ScrapeSarah E, RN

## 2018-07-06 NOTE — Progress Notes (Signed)
E-Link MD made aware of patient's oral secretions and slight "gurgling" sound while breathing. MD ordered new PRN medications for secretions. Will continue to monitor.

## 2018-07-06 NOTE — Progress Notes (Signed)
Nutrition Brief Note  Chart reviewed. Pt now transitioning to comfort care.  No further nutrition interventions warranted at this time.  Please re-consult as needed.   Jaelan Rasheed RD, LDN, CNSC [phone] Pager [phone] After Hours Pager    

## 2018-07-06 NOTE — Progress Notes (Signed)
eLink Physician-Brief Progress Note Patient Name: Gina Boone DOB: [birth year] MRN: [id number]   Date of Service  07/06/2018  HPI/Events of Note  Copious oral secretions - Patient is comfort measures.   eICU Interventions  Will order: 1. Atropine 1% solution 2 drops Q 6 hours PRN copious secretions.      Intervention Category Intermediate Interventions: Other:  Joury Allcorn Dennard Nipugene 07/06/2018, 1:03 AM

## 2018-07-07 DIAGNOSIS — B373 Candidiasis of vulva and vagina: Secondary | ICD-10-CM

## 2018-07-07 DIAGNOSIS — I63212 Cerebral infarction due to unspecified occlusion or stenosis of left vertebral arteries: Secondary | ICD-10-CM

## 2018-07-07 MED ORDER — NYSTATIN 100000 UNIT/GM EX CREA
TOPICAL_CREAM | Freq: Two times a day (BID) | CUTANEOUS | Status: DC
  Administered 2018-07-07: 22:00:00 via TOPICAL
  Administered 2018-07-07: 1 via TOPICAL
  Filled 2018-07-07: qty 15

## 2018-07-07 NOTE — Social Work (Signed)
CSW acknowledging that pt family has completed paperwork [facility]. Per bedside RN pt is not stable for transport.   If this changes, please reconsult CSW.   Doy HutchingIsabel H Kynzley Dowson, LCSWA [facility] Clinical Social Work [phone]

## 2018-07-07 NOTE — Progress Notes (Signed)
No charge note:   Quick visit to family for support. Patient being managed by critical care team and now family service- no symptom or disposition needs. Patient appears comfortable. Family very grateful for all care received during patient's stay. I agree that patient is not stable for transport.  Ocie BobKasie Colton Engdahl, AGNP-C Palliative Medicine  Please call Palliative Medicine team phone with any questions [phone]. For individual providers please see AMION.

## 2018-07-07 NOTE — Progress Notes (Signed)
Internal Medicine Attending:   I saw and examined the patient. I reviewed the resident's note and I agree with the resident's findings and plan as documented in the resident's note.  Patient with agonal breathing on exam today with increased pauses between her breaths.  Patient was initially admitted for an acute CVA which was complicated by aspiration pneumonia and is now on comfort care.  [facility] but is unstable for transfer at this time.  We will continue with morphine drip for respiratory discomfort.  I suspect that the patient will pass an hours to 1 to 2 days.  Case discussed with daughters at bedside who were in agreement with plan.

## 2018-07-07 NOTE — Progress Notes (Signed)
   Subjective: Patient seen lying asleep in bed this am accompanied by her two daughters. Her daughters are concerned about moving her while she is so unstable. Pts breathing is mildly agonal with increased pauses between breaths compared to seeing her earlier in the am.   Objective:  Vital signs in last 24 hours: Vitals:   07/06/18 1500 07/06/18 1605 07/07/18 0529 07/07/18 1100  BP:  (!) 153/64 (!) 135/55   Pulse: 84 87 94   Resp: 14 12 15    Temp:  98.1 F (36.7 C) 97.7 F (36.5 C)   TempSrc:  Oral Axillary   SpO2: 94% (!) 87% (!) 85%   Weight:    114 lb 10.2 oz (52 kg)  Height:        Constitution: pale, ill-appearing, asleep in bed with intermittent vocalizations, NAD HENT: right sided facial droop, atraumatic Cardio: RRR, no murmurs Respiratory: mild agonal breathing with pauses in breath, crackles left side, clear to auscultation right Abdominal: +bs, non-distended MSK: no LE or UE edema GU: erythematous, inflamed skin near vaginal area  Skin: clean, dry, and intact    Assessment/Plan:  Active Problems:   Cerebral thrombosis with cerebral infarction   Left pontine stroke (HCC)   Aspiration into airway   Dysarthria   Essential hypertension   Pure hypercholesterolemia   Malnutrition of moderate degree   Aspiration pneumonia of left lower lobe [facility](HCC)   Advanced care planning/counseling discussion   Palliative care by specialist   Goals of care, counseling/discussion   Acute respiratory failure with hypoxia [facility](HCC)   Hypoxia  [name] with recent history of probable SAH 6/23, TIA 7/7 who presented 7/9 with righthemiparesisin the UE &LE &right facial droop. After aspiration with barium swallow 7/10, pt had increased labor of breathing, hypoxia, and hypertensive urgency on 7/13. CXR showed increased aspiration and PNA in the left and right lower lobes compared to previous cxr. She was transferred to ICU, and was made comfort care. Spoke with family this  am who are concerned about patient being moved at this point. Mrs. Margo AyeHall appears very near to end of life, and we have decided to not transfer her at this time.   Yeast Infection:  - nystatin cream  CVA:MRI showed left vertebral artery occlusion and left pontine infarct.  - Morphine 1mg /mL infusion 8mg /hr IV cont.   Acute hypoxemia secondary to aspiration.Breathing improved,saturating in high 90s on 2 L today.Patient remains high risk for further aspiration. - Unasyn stopped as patient is comfort care  Uncontrolled ZOX:WRUEAHTN:Blood pressure remained elevated in 170s. - bp medications stopped   Abdominal distention/diarrhea.Patient denies any abdominal pain,with good bowel sounds.Most likely secondary to tube feed. - simethicone  Right leg swelling.Chronic issue with negative venous Doppler    Dispo: Patient expected to pass in hospital today.  Guinevere ScarletSeawell, Paarth Cropper A, DO 07/07/2018, 2:45 PM Pager: [phone]

## 2018-07-08 DIAGNOSIS — R092 Respiratory arrest: Secondary | ICD-10-CM

## 2018-07-08 DIAGNOSIS — I63349 Cerebral infarction due to thrombosis of unspecified cerebellar artery: Secondary | ICD-10-CM

## 2018-07-23 NOTE — Progress Notes (Signed)
Spiritual Care Consult Acknowledged and Page from unit. Family requested Chaplain for words of comfort, presence and prayer after death event. Chaplain invited two daughters present to offer reflection on the attributes of their mother that made the greatest impact on their lives. They both talked about their mother's strength.  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 Chaplain centered scripture and prayer on themes of season and gardening. Chaplain offered words of encouragement, Ministry of Presence, empathic listening and prayer.

## 2018-07-23 NOTE — Death Summary Note (Signed)
  Name: Gina Boone MRN: [id number] DOB: 1929-10-06 84 y.o.  Date of Admission: 07/13/2018  5:25 PM Date of Discharge: Apr 30, 2018 Attending Physician: Burns SpainButcher, Elizabeth A, MD  Discharge Diagnosis: Active Problems:   Cerebral thrombosis with cerebral infarction   Left pontine stroke (HCC)   Aspiration into airway   Dysarthria   Essential hypertension   Pure hypercholesterolemia   Malnutrition of moderate degree   Aspiration pneumonia of left lower lobe (HCC)   Advanced care planning/counseling discussion   Palliative care by specialist   Goals of care, counseling/discussion   Acute respiratory failure with hypoxia (HCC)   Hypoxia     Cause of death: Respiratory Arrest secondary to severe aspiration due to Acute CVA Time of death: 9:55 am  Disposition and follow-up:   [name].    Hospital Course: Gina Boone was admitted 06/29/2018 after CVA leading to severe dysarthria, dysphagia, and complete right hemiparesis. She was moved to the ICU and placed on comfort care 07/04/18  after multiple aspirations, development of left lower lobe pneumonia, and continued difficulties maintaining her airway. She was given morphine drip and [facility]. Due to her instability, it was decided yesterday 7/16 that she would remain in hospital. She passed away with her two daughters at bedside around 10:15am.  Signed: Josy Peaden A, DO Apr 30, 2018, 11:21 AM

## 2018-07-23 NOTE — Progress Notes (Signed)
Wasted  25mL of Morphine drip with Elease HashimotoKristie Beckner, RN.

## 2018-07-23 NOTE — Progress Notes (Signed)
Lia FoyerLindsey King, RN witnessed waste 25 ml of morphine.

## 2018-07-23 NOTE — Progress Notes (Signed)
   01-24-2018 1000  Attending Physican Contact  Attending Physician Notified Y  Attending Physician Name Dr. Aviva KluverSeawall  Will the above attending physician sign death certificate?  (non ED physician) Yes  Post Mortem Checklist  Date of Death 01-24-2018  Time of Death 0955  Pronounced By Gina Boone and Gina LiaLindsay King, RN  Next of kin notified Yes  Name of next of kin notified of death Gina Boone  Contact Person's Phone Number [phone]  Was the patient a No Code Blue or a Limited Code Blue? Yes  Did the patient die unattended? No  Patient restrained? Not applicable  Weight 52 kg (114 lb 10.2 oz)  [facility]  Notification Date 01-24-2018  Notification Time 1045  [place] [facility] Number [phone]) [phone] Gina Boone(Gina Boone)  Is patient a potential donor? N  Autopsy  Autopsy requested by N/A  Patient Belongings/Medications Returned  Patient belongings from bedside/safe/pharmacy returned  Yes (family to take belongings)  Valuables returned to? Gina Boone   Dead on Arrival (Emergency Department)  Patient dead on arrival? No  Medical Examiner  Is this a medical examiner's case? N  Funeral Home  [facility] name/address/phone # [facility]  Name/Address/Phone # of [facility] [employer]/[address], [place] [id number]/ [phone]  [facility]

## 2018-07-23 DEATH — deceased

## 2019-12-05 IMAGING — DX DG CHEST 1V PORT
1 series · 1 of 1 positions shown · non-contrast
Comparison: 07/01/2018 and 06/30/2018.

CLINICAL DATA: Acute respiratory failure.

EXAM:
PORTABLE CHEST 1 VIEW

[chest ap]
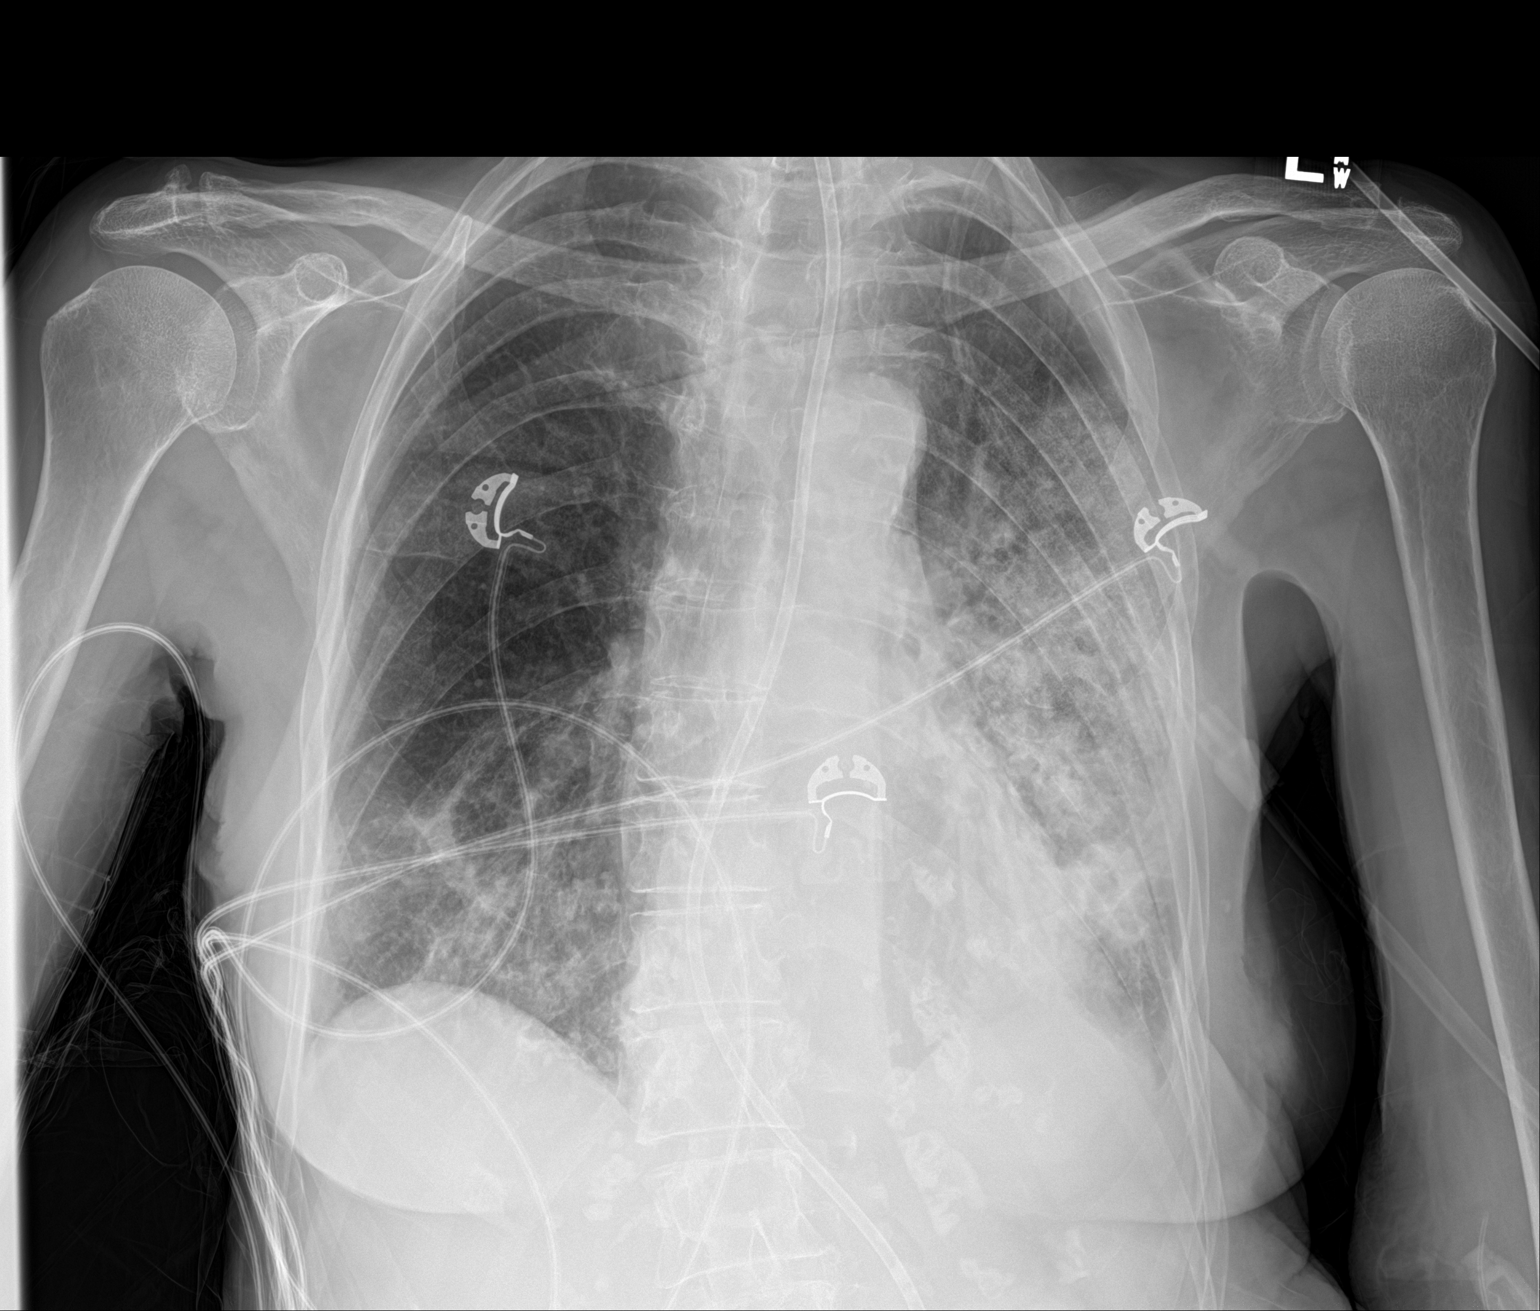

[1 of 1 positions shown; findings below may reference images not displayed]

FINDINGS: 6000 hour. The heart size and mediastinal contours are stable. There
is aortic atherosclerosis. Feeding tube remains in place, projecting
below the diaphragm, tip not visualized. There has been significant
worsening of the left-greater-than-right basilar airspace opacities,
likely due to aspiration pneumonia. Overall aeration of the
retrocardiac left lower lobe has mildly improved. There is no
significant pleural effusion or pneumothorax.
IMPRESSION: Worsening left-greater-than-right bibasilar airspace opacities
consistent with aspiration pneumonia.

These results will be called to the ordering clinician or
representative by the Radiologist Assistant, and communication
documented in the PACS or zVision Dashboard.
